# Patient Record
Sex: Male | Born: 1970 | Hispanic: Yes | Marital: Married | Smoking: Never smoker
Health system: Southern US, Community
[De-identification: ages and names within clinical notes are randomized; demographics above are authoritative.]

## PROBLEM LIST (undated history)

## (undated) DIAGNOSIS — H269 Unspecified cataract: Secondary | ICD-10-CM

## (undated) HISTORY — DX: Unspecified cataract: H26.9

## (undated) HISTORY — PX: EYE SURGERY: SHX253

## (undated) HISTORY — PX: VASECTOMY: SHX75

---

## 2013-09-27 ENCOUNTER — Ambulatory Visit (INDEPENDENT_AMBULATORY_CARE_PROVIDER_SITE_OTHER): Payer: BC Managed Care – PPO | Admitting: Emergency Medicine

## 2013-09-27 VITALS — BP 110/80 | HR 51 | Temp 98.0°F | Resp 16 | Ht 67.5 in | Wt 147.6 lb

## 2013-09-27 DIAGNOSIS — D72819 Decreased white blood cell count, unspecified: Secondary | ICD-10-CM

## 2013-09-27 DIAGNOSIS — E785 Hyperlipidemia, unspecified: Secondary | ICD-10-CM

## 2013-09-27 DIAGNOSIS — R51 Headache: Secondary | ICD-10-CM

## 2013-09-27 LAB — POCT CBC
GRANULOCYTE PERCENT: 56.7 % (ref 37–80)
HCT, POC: 42.2 % — AB (ref 43.5–53.7)
Hemoglobin: 13.5 g/dL — AB (ref 14.1–18.1)
Lymph, poc: 1.5 (ref 0.6–3.4)
MCH, POC: 27.1 pg (ref 27–31.2)
MCHC: 32 g/dL (ref 31.8–35.4)
MCV: 84.7 fL (ref 80–97)
MID (CBC): 0.2 (ref 0–0.9)
MPV: 8.2 fL (ref 0–99.8)
POC Granulocyte: 2.2 (ref 2–6.9)
POC LYMPH PERCENT: 37.4 %L (ref 10–50)
POC MID %: 5.9 %M (ref 0–12)
Platelet Count, POC: 255 10*3/uL (ref 142–424)
RBC: 4.98 M/uL (ref 4.69–6.13)
RDW, POC: 14.5 %
WBC: 3.9 10*3/uL — AB (ref 4.6–10.2)

## 2013-09-27 LAB — LIPID PANEL
Cholesterol: 174 mg/dL (ref 0–200)
HDL: 37 mg/dL — ABNORMAL LOW (ref >39)
LDL Cholesterol: 113 mg/dL — ABNORMAL HIGH (ref 0–99)
Total CHOL/HDL Ratio: 4.7 Ratio
Triglycerides: 119 mg/dL (ref <150)
VLDL: 24 mg/dL (ref 0–40)

## 2013-09-27 LAB — COMPREHENSIVE METABOLIC PANEL
ALBUMIN: 4.5 g/dL (ref 3.5–5.2)
ALK PHOS: 55 U/L (ref 39–117)
ALT: 19 U/L (ref 0–53)
AST: 19 U/L (ref 0–37)
BUN: 15 mg/dL (ref 6–23)
CO2: 28 mEq/L (ref 19–32)
Calcium: 9.6 mg/dL (ref 8.4–10.5)
Chloride: 102 mEq/L (ref 96–112)
Creat: 1.18 mg/dL (ref 0.50–1.35)
GLUCOSE: 97 mg/dL (ref 70–99)
POTASSIUM: 5 meq/L (ref 3.5–5.3)
SODIUM: 140 meq/L (ref 135–145)
TOTAL PROTEIN: 7.1 g/dL (ref 6.0–8.3)
Total Bilirubin: 0.4 mg/dL (ref 0.2–1.2)

## 2013-09-27 LAB — HIV ANTIBODY (ROUTINE TESTING W REFLEX): HIV: NONREACTIVE

## 2013-09-27 LAB — GLUCOSE, POCT (MANUAL RESULT ENTRY): POC Glucose: 89 mg/dl (ref 70–99)

## 2013-09-27 NOTE — Progress Notes (Signed)
   Subjective:    Patient ID: Jon Levy, male    DOB: 19-Apr-1971, 43 y.o.   MRN: [id number]  HPIPt here for headache and he thinks high cholesterol, he is from [place]. He has been diagnosed in his country with high cholesterol. He has had headache in the mornings. Denies blurry vision, sob, or chest pain.  Family history of diabetes and hypertension. But never taken medicine for high cholesterol. He was eating healthy and exercising.    Review of Systems     Objective:   Physical Exam H. EENT exam is normal. Disc margins are sharp. Neck supple. There is no thyromegaly. Chest clear to auscultation and percussion. Heart regular rate no murmurs rubs or gallops appreciated. Abdomen soft no tenderness or masses. Extremities  without cyanosis clubbing or edema.  Results for orders placed in visit on 09/27/13  POCT CBC      Result Value Ref Range   WBC 3.9 (*) 4.6 - 10.2 K/uL   Lymph, poc 1.5  0.6 - 3.4   POC LYMPH PERCENT 37.4  10 - 50 %L   MID (cbc) 0.2  0 - 0.9   POC MID % 5.9  0 - 12 %M   POC Granulocyte 2.2  2 - 6.9   Granulocyte percent 56.7  37 - 80 %G   RBC 4.98  4.69 - 6.13 M/uL   Hemoglobin 13.5 (*) 14.1 - 18.1 g/dL   HCT, POC 40.942.2 (*) 81.143.5 - 53.7 %   MCV 84.7  80 - 97 fL   MCH, POC 27.1  27 - 31.2 pg   MCHC 32.0  31.8 - 35.4 g/dL   RDW, POC 91.414.5     Platelet Count, POC 255  142 - 424 K/uL   MPV 8.2  0 - 99.8 fL  GLUCOSE, POCT (MANUAL RESULT ENTRY)      Result Value Ref Range   POC Glucose 89  70 - 99 mg/dl        Assessment & Plan:  His exam is essentially normal. I will call patient once again his lipids back.. I. didn't HIV test because of his slightly low hemoglobin and low white count

## 2013-12-19 ENCOUNTER — Ambulatory Visit (INDEPENDENT_AMBULATORY_CARE_PROVIDER_SITE_OTHER): Payer: BC Managed Care – PPO | Admitting: Family Medicine

## 2013-12-19 ENCOUNTER — Encounter: Payer: Self-pay | Admitting: Family Medicine

## 2013-12-19 VITALS — BP 134/77 | HR 66 | Temp 98.3°F | Resp 20 | Ht 67.0 in | Wt 150.0 lb

## 2013-12-19 DIAGNOSIS — R109 Unspecified abdominal pain: Secondary | ICD-10-CM

## 2013-12-19 DIAGNOSIS — Z8719 Personal history of other diseases of the digestive system: Secondary | ICD-10-CM

## 2013-12-19 LAB — POCT URINALYSIS DIPSTICK
Bilirubin, UA: NEGATIVE
Blood, UA: NEGATIVE
GLUCOSE UA: NEGATIVE
Ketones, UA: NEGATIVE
Leukocytes, UA: NEGATIVE
NITRITE UA: NEGATIVE
Protein, UA: NEGATIVE
Spec Grav, UA: 1.015
UROBILINOGEN UA: 0.2
pH, UA: 7

## 2013-12-19 MED ORDER — OMEPRAZOLE 20 MG PO CPDR: 20.0000 mg | DELAYED_RELEASE_CAPSULE | Freq: Every day | ORAL | Status: DC

## 2013-12-19 NOTE — Patient Instructions (Signed)
Gastritis - Adultos  °(Gastritis, Adult) ° Gastrittis es la hinchazón e irritación (inflamación) del revestimiento interno del estómago. Si no recibe tratamiento, la gastritis puede causar sangrado y llagas.(úlceras) en el estómago. °CUIDADOS EN EL HOGAR  °· Sólo tome los medicamentos según le indique el médico. °· Si le han recetado antibióticos, tómelos según las indicaciones. Termine de tomar el medicamento, aunque comience a sentirse mejor. °· Beba gran cantidad de líquido para mantener el pis (orina) de tono claro o amarillo pálido. °· Evite las comidas y bebidas que empeoran los problemas. Los alimentos que debe evitar son: °¨ Cafeína y alcohol. °¨ Chocolate. °¨ Menta. °¨ Ajo y cebolla. °¨ Comidas muy condimentadas. °¨ Cítricos como naranjas, limones o limas. °¨ Alimentos que contengan tomate, como salsas, chile y pizza. °¨ Alimentos fritos y grasos. °· Haga comidas pequeñas durante el día en lugar de 3 comidas abundantes. °SOLICITE AYUDA DE INMEDIATO SI:  °· La materia fecal (heces)es negra o de color rojo oscuro. °· Vomita sangre. Puede ser similar a la borra del café °· No puede retener los líquidos. °· El dolor en el vientre (abdominal) empeora. °· Tiene fiebre. °· No mejora luego de 1 semana. °· Tiene preguntas o preocupaciones. °ASEGÚRESE DE QUE:  °· Comprende estas instrucciones. °· Controlará su enfermedad. °· Solicitará ayuda de inmediato si no mejora o si empeora. °Document Released: 11/21/2011 °ExitCare® Patient Information ©2015 [employer]. This information is not intended to replace advice given to you by your health care provider. Make sure you discuss any questions you have with your health care provider. ° °

## 2013-12-19 NOTE — Progress Notes (Signed)
Subjective:    Patient ID: Jon Levy, male    DOB: [birth year], 43 y.o.   MRN: [id number]  HPI  This 43 y.o. [place] male is here for evaluation of epigastric discomfort, onset several weeeks ago. He has a hx of gastritis diagnosed many years ago; he does not recall taking any prescription medication for this problem. HE takes no OTC medication now when it flares up. He has some minor indigestion-like symptoms w/ certain foods.  He c/o L flank pain; lying on the left side at night provides minimal relief. He notes no change in bladder or bowel habits. He denies BRBPR or melena.  He does avoid most dairy products as these foods cause much gas and bloating.  PMHx, Surg Hx, Soc and Fam Hx reviewed.   Review of Systems  Constitutional: Negative.   HENT: Negative.   Eyes: Negative.   Respiratory: Negative.   Cardiovascular: Negative.   Gastrointestinal: Positive for abdominal pain and abdominal distention. Negative for nausea, vomiting, diarrhea, constipation, blood in stool and rectal pain.  Endocrine: Negative.   Musculoskeletal: Negative.   Skin: Negative.   Neurological: Negative.   Hematological: Negative.   Psychiatric/Behavioral: Negative.       Objective:   Physical Exam  Nursing note and vitals reviewed. Constitutional: He is oriented to person, place, and time. Vital signs are normal. He appears well-developed and well-nourished. No distress.  HENT:  Head: Normocephalic and atraumatic.  Right Ear: Hearing, tympanic membrane, external ear and ear canal normal.  Left Ear: Hearing, tympanic membrane, external ear and ear canal normal.  Nose: Nose normal. No mucosal edema, nasal deformity or septal deviation.  Mouth/Throat: Uvula is midline, oropharynx is clear and moist and mucous membranes are normal. No oral lesions. Normal dentition.  Eyes: EOM and lids are normal. Pupils are equal, round, and reactive to light. No scleral icterus.  Neck: Normal range of motion and full  passive range of motion without pain. Neck supple. No spinous process tenderness and no muscular tenderness present. No mass and no thyromegaly present.  Cardiovascular: Normal rate, regular rhythm, S1 normal, S2 normal and normal heart sounds.   No extrasystoles are present. Exam reveals no gallop and no friction rub.   No murmur heard. Pulmonary/Chest: Effort normal and breath sounds normal. No respiratory distress.  Abdominal: Soft. Normal appearance. He exhibits no distension and no mass. Bowel sounds are decreased. There is no hepatosplenomegaly or hepatomegaly. There is tenderness in the epigastric area and left upper quadrant. There is no guarding and no CVA tenderness.  Musculoskeletal: Normal range of motion. He exhibits no edema and no tenderness.  Neurological: He is alert and oriented to person, place, and time. No cranial nerve deficit. He exhibits normal muscle tone. Coordination normal.  Skin: Skin is warm and dry. No rash noted. He is not diaphoretic. No erythema.  Psychiatric: He has a normal mood and affect. His behavior is normal. Judgment and thought content normal.    Results for orders placed in visit on 12/19/13  POCT URINALYSIS DIPSTICK      Result Value Ref Range   Color, UA yellow     Clarity, UA clear     Glucose, UA neg     Bilirubin, UA neg     Ketones, UA neg     Spec Grav, UA 1.015     Blood, UA neg     pH, UA 7.0     Protein, UA neg     Urobilinogen, UA  0.2     Nitrite, UA neg     Leukocytes, UA Negative        Assessment & Plan:  Flank pain - Plan: POCT urinalysis dipstick, H. pylori antibody, IgG  Hx of gastritis - Trial of PPI and continued dietary modifications. Plan: POCT urinalysis dipstick, H. pylori antibody, IgG   Meds ordered this encounter  Medications  . omeprazole (PRILOSEC) 20 MG capsule    Sig: Take 1 capsule (20 mg total) by mouth daily.    Dispense:  30 capsule    Refill:  3

## 2013-12-21 DIAGNOSIS — Z8719 Personal history of other diseases of the digestive system: Secondary | ICD-10-CM | POA: Insufficient documentation

## 2013-12-22 LAB — H. PYLORI ANTIBODY, IGG: H Pylori IgG: >8 {ISR} — ABNORMAL HIGH

## 2013-12-23 ENCOUNTER — Other Ambulatory Visit: Payer: Self-pay | Admitting: Family Medicine

## 2013-12-23 MED ORDER — AMOXICILL-CLARITHRO-LANSOPRAZ PO MISC: Freq: Two times a day (BID) | ORAL | Status: DC

## 2013-12-29 ENCOUNTER — Ambulatory Visit (INDEPENDENT_AMBULATORY_CARE_PROVIDER_SITE_OTHER): Payer: BC Managed Care – PPO | Admitting: Emergency Medicine

## 2013-12-29 VITALS — BP 122/78 | HR 78 | Temp 98.0°F | Resp 17 | Ht 68.5 in | Wt 150.0 lb

## 2013-12-29 DIAGNOSIS — H6120 Impacted cerumen, unspecified ear: Secondary | ICD-10-CM

## 2013-12-29 DIAGNOSIS — H6123 Impacted cerumen, bilateral: Secondary | ICD-10-CM

## 2013-12-29 NOTE — Patient Instructions (Signed)
Impaccion De Cerumen °(Cerumen Impaction) °Su examen muestra que usted ha tenido una impacción de cerumen. Ésto significa que el cerumen del oído se ha compactado y ha formado un tapón. Este tapon generalmente causa reducción de la audición; pero a veces también causa dolor de oído o mareo. La extracción de la impacción de cerumen puede ser difícil y dolorosa, ya que el cerumen se adhiere al conducto auditivo, el cual es muy sensible y sangra fácilmente. Si usted trata de remover una gran acumulación de cerumen del oído, utilizando un palillo de algodón, ésto puede hacer que el cerumen se introduzca aún más hacia adentro. °La irrigación con agua, succión, y el uso de pequeñas curetas para el oído pueden asistir en la limpieza del cerumen. Si la impacción se encuentra fijada a la piel del conducto auditivo, podrá ser necesario usar gotas para el oído, por varios días, para aflojar el cerumen. Las personas que forman mucho cerumen con frecuencia, pueden usar productos a la venta en la farmacia para removerlo. °SOLICITE ATENCIÓN MÉDICA SI: °Usted desarrolla un dolor de oído, aumento pérdida de la audición, o mareo pronunciado. °Document Released: 05/22/2005 Document Revised: 08/14/2011 °ExitCare® Patient Information ©2015 [employer]. This information is not intended to replace advice given to you by your health care provider. Make sure you discuss any questions you have with your health care provider. ° °

## 2013-12-29 NOTE — Progress Notes (Signed)
[facility] [address] [place] [address] [phone]  Date:  12/29/2013   Name:  Jon Levy   DOB:  05-25-1971   MRN:  [id number]  PCP:  No PCP Per Patient    Chief Complaint: Otalgia and Jaw Pain   History of Present Illness:  BENJIMEN KELLEY is a 43 y.o. very pleasant male patient who presents with the following:  Has pain in right ear and left ear is stopped up with diminished hearing. Says had intense right ear pain last night after eating ice.  No fever or chills, cough or coryza. No improvement with over the counter medications or other home remedies. Denies other complaint or health concern today.   Patient Active Problem List   Diagnosis Date Noted  . History of gastritis 12/21/2013    No past medical history on file.  No past surgical history on file.  History  Substance Use Topics  . Smoking status: Never Smoker   . Smokeless tobacco: Not on file  . Alcohol Use: Not on file    No family history on file.  No Known Allergies  Medication list has been reviewed and updated.  Current Outpatient Prescriptions on File Prior to Visit  Medication Sig Dispense Refill  . amoxicillin-clarithromycin-lansoprazole (PREVPAC) combo pack Take by mouth 2 (two) times daily. Follow package directions.  1 kit  0  . omeprazole (PRILOSEC) 20 MG capsule Take 1 capsule (20 mg total) by mouth daily.  30 capsule  3   No current facility-administered medications on file prior to visit.    Review of Systems:  As per HPI, otherwise negative.    Physical Examination: Filed Vitals:   12/29/13 1020  BP: 122/78  Pulse: 78  Temp: 98 F (36.7 C)  Resp: 17   Filed Vitals:   12/29/13 1020  Height: 5' 8.5" (1.74 m)  Weight: 150 lb (68.04 kg)   Body mass index is 22.47 kg/(m^2). Ideal Body Weight: Weight in (lb) to have BMI = 25: 166.5   GEN: WDWN, NAD, Non-toxic, Alert & Oriented x 3 HEENT: Atraumatic, Normocephalic.  Ears and Nose: No external  deformity. Bilateral cerumen impaction EXTR: No clubbing/cyanosis/edema NEURO: Normal gait.  PSYCH: Normally interactive. Conversant. Not depressed or anxious appearing.  Calm demeanor.    Assessment and Plan: Cerumen impaction Lavage  Signed,  Ellison Carwin, MD

## 2015-02-23 ENCOUNTER — Ambulatory Visit (INDEPENDENT_AMBULATORY_CARE_PROVIDER_SITE_OTHER): Payer: BLUE CROSS/BLUE SHIELD | Admitting: Family Medicine

## 2015-02-23 VITALS — BP 120/80 | HR 58 | Temp 97.4°F | Resp 16 | Ht 69.0 in | Wt 158.0 lb

## 2015-02-23 DIAGNOSIS — Z1389 Encounter for screening for other disorder: Secondary | ICD-10-CM

## 2015-02-23 DIAGNOSIS — Z1329 Encounter for screening for other suspected endocrine disorder: Secondary | ICD-10-CM

## 2015-02-23 DIAGNOSIS — Z0000 Encounter for general adult medical examination without abnormal findings: Secondary | ICD-10-CM | POA: Diagnosis not present

## 2015-02-23 DIAGNOSIS — R1013 Epigastric pain: Secondary | ICD-10-CM | POA: Diagnosis not present

## 2015-02-23 DIAGNOSIS — Z1383 Encounter for screening for respiratory disorder NEC: Secondary | ICD-10-CM

## 2015-02-23 DIAGNOSIS — Z136 Encounter for screening for cardiovascular disorders: Secondary | ICD-10-CM | POA: Diagnosis not present

## 2015-02-23 DIAGNOSIS — Z23 Encounter for immunization: Secondary | ICD-10-CM

## 2015-02-23 LAB — CBC WITH DIFFERENTIAL/PLATELET
BASOS ABS: 0 10*3/uL (ref 0.0–0.1)
Basophils Relative: 0 % (ref 0–1)
EOS ABS: 0 10*3/uL (ref 0.0–0.7)
Eosinophils Relative: 1 % (ref 0–5)
HEMATOCRIT: 44.8 % (ref 39.0–52.0)
HEMOGLOBIN: 15.3 g/dL (ref 13.0–17.0)
LYMPHS ABS: 1.8 10*3/uL (ref 0.7–4.0)
Lymphocytes Relative: 42 % (ref 12–46)
MCH: 27.2 pg (ref 26.0–34.0)
MCHC: 34.2 g/dL (ref 30.0–36.0)
MCV: 79.6 fL (ref 78.0–100.0)
MPV: 9.1 fL (ref 8.6–12.4)
Monocytes Absolute: 0.4 10*3/uL (ref 0.1–1.0)
Monocytes Relative: 10 % (ref 3–12)
NEUTROS ABS: 2 10*3/uL (ref 1.7–7.7)
Neutrophils Relative %: 47 % (ref 43–77)
PLATELETS: 211 10*3/uL (ref 150–400)
RBC: 5.63 MIL/uL (ref 4.22–5.81)
RDW: 14.2 % (ref 11.5–15.5)
WBC: 4.3 10*3/uL (ref 4.0–10.5)

## 2015-02-23 LAB — LIPID PANEL
CHOLESTEROL: 193 mg/dL (ref 125–200)
HDL: 36 mg/dL — ABNORMAL LOW (ref >=40)
LDL Cholesterol: 108 mg/dL (ref <130)
Total CHOL/HDL Ratio: 5.4 Ratio — ABNORMAL HIGH (ref <=5.0)
Triglycerides: 245 mg/dL — ABNORMAL HIGH (ref <150)
VLDL: 49 mg/dL — AB (ref <30)

## 2015-02-23 LAB — COMPREHENSIVE METABOLIC PANEL
ALK PHOS: 48 U/L (ref 40–115)
ALT: 24 U/L (ref 9–46)
AST: 19 U/L (ref 10–40)
Albumin: 4.6 g/dL (ref 3.6–5.1)
BUN: 13 mg/dL (ref 7–25)
CALCIUM: 9.3 mg/dL (ref 8.6–10.3)
CO2: 28 mmol/L (ref 20–31)
Chloride: 104 mmol/L (ref 98–110)
Creat: 0.96 mg/dL (ref 0.60–1.35)
Glucose, Bld: 88 mg/dL (ref 65–99)
Potassium: 4.4 mmol/L (ref 3.5–5.3)
Sodium: 140 mmol/L (ref 135–146)
TOTAL PROTEIN: 7.1 g/dL (ref 6.1–8.1)
Total Bilirubin: 0.5 mg/dL (ref 0.2–1.2)

## 2015-02-23 LAB — TSH: TSH: 1.479 u[IU]/mL (ref 0.350–4.500)

## 2015-02-23 MED ORDER — OMEPRAZOLE 40 MG PO CPDR: 40.0000 mg | DELAYED_RELEASE_CAPSULE | Freq: Every day | ORAL | Status: DC

## 2015-02-23 NOTE — Progress Notes (Signed)
Patient ID: Jon Levy, male   DOB: 03-18-1971, 44 y.o.   MRN: [id number]  Case discussed with Dr. Noreene Filbert when patient is in the office. EKG: NSR, no ischemic changes. Pt with continuing gastritis sxs with + h. Pylori ab last yr treated with prev pac so repeat breath test today to ensure eradication and restart ppi. Reviewed documentation and EKG and agree w/ assessment and plan.  Jon Sorenson, MD MPH

## 2015-02-23 NOTE — Progress Notes (Signed)
   Subjective:    Jon Levy - 44 y.o. male MRN [id number]  Date of birth: Mar 19, 1971  HPI  Jon Levy is here for here for his physical and centralized abdominal pain.   He is originally from [place].  He moved to the [place] and [place] about 3 year ago.  He has two children.  His wife teaches Spanish.  He denies any PMH.  Never tobacco smoker and no EtOH or illicit drug use.  Does not perform regular exercise  Fhx for mother with diabetes and passed away from brain tumor and father with HTN and Dm2 and sclerosis.    ABDOMINAL PAIN  Pain is centralized and radiates proximally.  It is worse in the am.  Improves after he eats.  Pain is sharp and intermittent in nature.  Hx of H. Pylori and gastritis upon chart review.  Pain began several month ago.  Medications tried: none currently but has taken omeprazole which improved his symptoms  Similar pain before:yes  Prior abdominal surgeries: no  Symptoms Nausea/vomiting: no Diarrhea: no Constipation: no Blood in stool: no Blood in vomit: no Fever: no Dysuria: no Loss of appetite: no Weight loss: no  Health Maintenance:  Health Maintenance Due  Topic Date Due  . TETANUS/TDAP  07/18/1987  . INFLUENZA VACCINE  01/04/2015    -  reports that he has never smoked. He does not have any smokeless tobacco history on file. - Review of Systems: Per HPI. - Past Medical History: Patient Active Problem List   Diagnosis Date Noted  . History of gastritis 12/21/2013   - Medications: reviewed and updated Current Outpatient Prescriptions  Medication Sig Dispense Refill  . omeprazole (PRILOSEC) 20 MG capsule Take 1 capsule (20 mg total) by mouth daily. (Patient not taking: Reported on 02/23/2015) 30 capsule 3   No current facility-administered medications for this visit.     Review of Systems See HPI     Objective:   Physical Exam BP 120/80 mmHg  Pulse 58  Temp(Src) 97.4 F (36.3 C) (Oral)  Resp 16  Ht  (1.753  m)  Wt 158 lb (71.668 kg)  BMI 23.32 kg/m2  SpO2 98% Gen: NAD, alert, cooperative with exam, well-appearing HEENT: NCAT, clear conjunctiva,  supple neck CV: RRR, good S1/S2, no murmur, no edema, capillary refill brisk  Resp: CTABL, no wheezes, non-labored Abd: SNTND, BS present, no guarding or organomegaly Skin: no rashes, normal turgor  Neuro: no gross deficits.  Psych: good insight, alert and oriented     Assessment & Plan:   Annual exam  Doing well  TSH  EKG  F/u in 1 year   Abdominal pain: most likely gastritis.  Possible for h. Pylori as patient doesn't seem to report taking medication.  No weight loss, melena or hematemesis.  - refill ompreprazole  - breath test today  - CMP

## 2015-02-23 NOTE — Patient Instructions (Signed)
Dolor abdominal °(Abdominal Pain) °El dolor puede tener muchas causas. Normalmente la causa del dolor abdominal no es una enfermedad y mejorará sin tratamiento. Frecuentemente puede controlarse y tratarse en casa. Su médico le realizará un examen físico y posiblemente solicite análisis de sangre y radiografías para ayudar a determinar la gravedad de su dolor. Sin embargo, en muchos casos, debe transcurrir más tiempo antes de que se pueda encontrar una causa evidente del dolor. Antes de llegar a ese punto, es posible que su médico no sepa si necesita más pruebas o un tratamiento más profundo. °INSTRUCCIONES PARA EL CUIDADO EN EL HOGAR  °Esté atento al dolor para ver si hay cambios. Las siguientes indicaciones ayudarán a aliviar cualquier molestia que pueda sentir: °· Tome solo medicamentos de venta libre o recetados, según las indicaciones del médico. °· No tome laxantes a menos que se lo haya indicado su médico. °· Pruebe con una dieta líquida absoluta (caldo, té o agua) según se lo indique su médico. Introduzca gradualmente una dieta normal, según su tolerancia. °SOLICITE ATENCIÓN MÉDICA SI: °· Tiene dolor abdominal sin explicación. °· Tiene dolor abdominal relacionado con náuseas o diarrea. °· Tiene dolor cuando orina o defeca. °· Experimenta dolor abdominal que lo despierta de noche. °· Tiene dolor abdominal que empeora o mejora cuando come alimentos. °· Tiene dolor abdominal que empeora cuando come alimentos grasosos. °· Tiene fiebre. °SOLICITE ATENCIÓN MÉDICA DE INMEDIATO SI:  °· El dolor no desaparece en un plazo máximo de 2 horas. °· No deja de (vomitar). °· El dolor se siente solo en partes del abdomen, como el lado derecho o la parte inferior izquierda del abdomen. °· Evacúa materia fecal sanguinolenta o negra, de aspecto alquitranado. °ASEGÚRESE DE QUE: °· Comprende estas instrucciones. °· Controlará su afección. °· Recibirá ayuda de inmediato si no mejora o si empeora. °Document Released: 05/22/2005  Document Revised: 05/27/2013 °ExitCare® Patient Information ©2015 [employer]. This information is not intended to replace advice given to you by your health care provider. Make sure you discuss any questions you have with your health care provider. ° °

## 2015-02-24 LAB — H. PYLORI BREATH TEST: H. PYLORI BREATH TEST: NOT DETECTED

## 2015-04-05 ENCOUNTER — Encounter (HOSPITAL_COMMUNITY)
Admission: RE | Disposition: A | Discharge status: Home / Self Care | Payer: Self-pay | Source: Ambulatory Visit | Attending: Ophthalmology

## 2015-04-05 ENCOUNTER — Ambulatory Visit (HOSPITAL_COMMUNITY)
Admission: RE | Admit: 2015-04-05 | Discharge: 2015-04-05 | Disposition: A | Discharge status: Home / Self Care | Payer: BLUE CROSS/BLUE SHIELD | Source: Ambulatory Visit | Attending: Ophthalmology | Admitting: Ophthalmology

## 2015-04-05 ENCOUNTER — Encounter (HOSPITAL_COMMUNITY): Payer: Self-pay | Admitting: *Deleted

## 2015-04-05 DIAGNOSIS — H5201 Hypermetropia, right eye: Secondary | ICD-10-CM | POA: Diagnosis not present

## 2015-04-05 DIAGNOSIS — H26492 Other secondary cataract, left eye: Secondary | ICD-10-CM | POA: Insufficient documentation

## 2015-04-05 DIAGNOSIS — H524 Presbyopia: Secondary | ICD-10-CM | POA: Insufficient documentation

## 2015-04-05 DIAGNOSIS — H52223 Regular astigmatism, bilateral: Secondary | ICD-10-CM | POA: Insufficient documentation

## 2015-04-05 DIAGNOSIS — Z961 Presence of intraocular lens: Secondary | ICD-10-CM | POA: Diagnosis not present

## 2015-04-05 HISTORY — PX: YAG LASER APPLICATION: SHX6189

## 2015-04-05 SURGERY — TREATMENT, USING YAG LASER
Anesthesia: LOCAL | Laterality: Left

## 2015-04-05 MED ORDER — TETRACAINE HCL 0.5 % OP SOLN
OPHTHALMIC | Status: AC
  Filled 2015-04-05: qty 2

## 2015-04-05 MED ORDER — TROPICAMIDE 1 % OP SOLN
OPHTHALMIC | Status: AC
  Filled 2015-04-05: qty 3

## 2015-04-05 MED ORDER — TETRACAINE HCL 0.5 % OP SOLN
1.0000 [drp] | Freq: Once | OPHTHALMIC | Status: AC
  Administered 2015-04-05: 1 [drp] via OPHTHALMIC

## 2015-04-05 MED ORDER — TROPICAMIDE 1 % OP SOLN
1.0000 [drp] | OPHTHALMIC | Status: AC
  Administered 2015-04-05 (×3): 1 [drp] via OPHTHALMIC

## 2015-04-05 NOTE — H&P (Signed)
I have reviewed the pre printed H&P, the patient was re-examined, and I have identified no significant interval changes in the patient's medical condition.  There is no change in the plan of care since the history and physical of record. 

## 2015-04-05 NOTE — Brief Op Note (Signed)
Jon Levy 04/05/2015  [name] [name], MD  Pre-op Diagnosis:  secondary cataract left eye  Post-op Diagnosis: same  Yag laser self-test completed: Yes.    Indications:  See scanned office H&P for indications  Procedure:  YAG posterior capsulotomy OS  Eye protection worn by staff:  Yes.   Laser In Use sign on door:  Yes.    Laser:  {LUMENIS SELECTA DUET YAG/SLT LASER  Power Setting:  1.7 mJ/burst Anatomical site treated:  Posterior capsule OS Number of applications:  35 Total energy delivered: 57.00  mJ Results:  Open visual axis OS  Patient was instructed to go to the office, as previously scheduled, for intraocular pressure:  No.  Patient verbalizes understanding of discharge instructions:  Yes.    Notes:   Patient tolerated procedure well.  There were no complications.

## 2015-04-05 NOTE — Discharge Instructions (Signed)
Riki Sheerdgar R Milling  04/05/2015     Instructions    Activity: No Restrictions.   Diet: Resume Diet you were on at home.   Pain Medication: Tylenol if Needed.   CONTACT YOUR DOCTOR IF YOU HAVE PAIN, REDNESS IN YOUR EYE, OR DECREASED VISION.   Follow-up: 04/27/2015 at 11:15 with [name] F Haines, MD.   Dr. Lita MainsHaines: [phone]        If you find that you cannot contact your physician, but feel that your signs and   Symptoms warrant a physician's attention, call the Emergency Room at   [phone] ext.[phone].

## 2016-03-15 NOTE — Progress Notes (Signed)
Subjective:    Patient ID: Jon Levy, male    DOB: February 11, 1971, 45 y.o.   MRN: [id number] Chief Complaint  Patient presents with  . Immunizations    flu vaccine and tdap  . Flank Pain    LEFT side on/off with bloating in the morning    HPI  Jon Levy is a delightful 45 yo male last seen 1 yr prior for his CPE here today to update his immunizations.  He nhad morning HA x 2-3 yrs and went away when he stopped eating chicken in the morning Uese ppi occastionally - after doctor tells him.  He has 15 yrs of pain and  10 years ago he saw  - in [place]. -= 2 years  No past medical history on file. Past Surgical History:  Procedure Laterality Date  . VASECTOMY    . YAG LASER APPLICATION Left 04/05/2015   Procedure: YAG LASER APPLICATION;  Surgeon: Susa Simmonds, MD;  Location: [facility];  Service: Ophthalmology;  Laterality: Left;   No current outpatient prescriptions on file prior to visit.   No current facility-administered medications on file prior to visit.    Not on File Family History  Problem Relation Age of Onset  . Diabetes Mother   . Hypertension Mother   . High Cholesterol Mother   . Diabetes Father   . High Cholesterol Father    Social History   Social History  . Marital status: Married    Spouse name: N/A  . Number of children: N/A  . Years of education: N/A   Social History Main Topics  . Smoking status: Never Smoker  . Smokeless tobacco: None  . Alcohol use None  . Drug use: Unknown  . Sexual activity: Not Asked   Other Topics Concern  . None   Social History Narrative  . None    Review of Systems See hpi    Objective:   Physical Exam  Constitutional: He appears well-developed and well-nourished. No distress.  HENT:  Head: Normocephalic and atraumatic.  Neck: Normal range of motion. Neck supple. No thyromegaly present.  Cardiovascular: Normal rate, regular rhythm and normal heart sounds.   Pulmonary/Chest: Effort normal and breath  sounds normal.  Abdominal: Soft. Normal appearance and bowel sounds are normal. He exhibits no distension and no mass. There is no tenderness. There is no rebound, no guarding and no CVA tenderness. No hernia.  Genitourinary: Rectum normal and prostate normal. Rectal exam shows no tenderness, anal tone normal and guaiac negative stool.  Lymphadenopathy:    He has no cervical adenopathy.  Skin: He is not diaphoretic.    BP 110/88 (BP Location: Left Arm, Patient Position: Sitting, Cuff Size: Normal)   Pulse 73   Temp 98.4 F (36.9 C) (Oral)   Resp 16   Ht 5\' 8"  (1.727 m)   Wt 161 lb 9.6 oz (73.3 kg)   SpO2 98%   BMI 24.57 kg/m    Results for orders placed or performed in visit on 03/16/16  POCT urinalysis dipstick  Result Value Ref Range   Color, UA yellow yellow   Clarity, UA clear clear   Glucose, UA negative negative   Bilirubin, UA negative negative   Ketones, POC UA negative negative   Spec Grav, UA 1.010    Blood, UA trace-intact (A) negative   pH, UA 7.0    Protein Ur, POC negative negative   Urobilinogen, UA 0.2    Nitrite, UA Negative Negative  Leukocytes, UA Negative Negative  POCT CBC  Result Value Ref Range   WBC 5.6 4.6 - 10.2 K/uL   Lymph, poc 1.9 0.6 - 3.4   POC LYMPH PERCENT 33.7 10 - 50 %L   MID (cbc) 0.2 0 - 0.9   POC MID % 3.0 0 - 12 %M   POC Granulocyte 3.5 2 - 6.9   Granulocyte percent 63.3 37 - 80 %G   RBC 5.39 4.69 - 6.13 M/uL   Hemoglobin 14.8 14.1 - 18.1 g/dL   HCT, POC 16.142.1 (A) 09.643.5 - 53.7 %   MCV 78.2 (A) 80 - 97 fL   MCH, POC 27.4 27 - 31.2 pg   MCHC 35.1 31.8 - 35.4 g/dL   RDW, POC 04.513.8 %   Platelet Count, POC 233 142 - 424 K/uL   MPV 6.8 0 - 99.8 fL       Assessment & Plan:  If he does not get improvement from the GI cocktail, would recommend abd xray 1. Need for prophylactic vaccination and inoculation against influenza   2. Flank pain, acute   3. Abdominal pain, left upper quadrant     Orders Placed This Encounter    Procedures  . US Abdomen Complete    Wt-161/not diab/no needs Ins-[facility],pt Epic order    Standing Status:   Future    Number of Occurrences:   1    Standing Expiration Date:   05/16/2017    Order Specific Question:   Reason for Exam (SYMPTOM  OR DIAGNOSIS REQUIRED)    Answer:   recurrent left upper quandrant abd pain    Order Specific Question:   Preferred imaging location?    Answer:   [facility]  . Flu Vaccine QUAD 36+ mos IM  . Urine Microscopic  . Comprehensive metabolic panel  . Lipase  . H. pylori breath test  . POCT urinalysis dipstick  . POCT CBC    Meds ordered this encounter  Medications  . gi cocktail (Maalox,Lidocaine,Donnatal)  . lansoprazole (PREVACID) 30 MG capsule    Sig: Take 1 capsule (30 mg total) by mouth daily at 12 noon. Daily for 2 months, then as needed    Dispense:  30 capsule    Refill:  3     Norberto SorensonEva Tremane Spurgeon, M.D.  [facility]  [place] [address] [place], [place] [address] [phone] phone [phone] fax  04/07/16 8:18 AM

## 2016-03-16 ENCOUNTER — Encounter: Payer: Self-pay | Admitting: Family Medicine

## 2016-03-16 ENCOUNTER — Ambulatory Visit (INDEPENDENT_AMBULATORY_CARE_PROVIDER_SITE_OTHER): Payer: BLUE CROSS/BLUE SHIELD | Admitting: Family Medicine

## 2016-03-16 VITALS — BP 110/88 | HR 73 | Temp 98.4°F | Resp 16 | Ht 68.0 in | Wt 161.6 lb

## 2016-03-16 DIAGNOSIS — R109 Unspecified abdominal pain: Secondary | ICD-10-CM | POA: Diagnosis not present

## 2016-03-16 DIAGNOSIS — R1012 Left upper quadrant pain: Secondary | ICD-10-CM | POA: Diagnosis not present

## 2016-03-16 DIAGNOSIS — Z23 Encounter for immunization: Secondary | ICD-10-CM | POA: Diagnosis not present

## 2016-03-16 LAB — COMPREHENSIVE METABOLIC PANEL
ALBUMIN: 4.5 g/dL (ref 3.6–5.1)
ALK PHOS: 62 U/L (ref 40–115)
ALT: 22 U/L (ref 9–46)
AST: 19 U/L (ref 10–40)
BILIRUBIN TOTAL: 0.5 mg/dL (ref 0.2–1.2)
BUN: 12 mg/dL (ref 7–25)
CALCIUM: 9.2 mg/dL (ref 8.6–10.3)
CO2: 27 mmol/L (ref 20–31)
CREATININE: 0.92 mg/dL (ref 0.60–1.35)
Chloride: 102 mmol/L (ref 98–110)
Glucose, Bld: 91 mg/dL (ref 65–99)
Potassium: 4.1 mmol/L (ref 3.5–5.3)
SODIUM: 139 mmol/L (ref 135–146)
TOTAL PROTEIN: 7 g/dL (ref 6.1–8.1)

## 2016-03-16 LAB — POCT CBC
Granulocyte percent: 63.3 % (ref 37–80)
HCT, POC: 42.1 % — AB (ref 43.5–53.7)
Hemoglobin: 14.8 g/dL (ref 14.1–18.1)
Lymph, poc: 1.9 (ref 0.6–3.4)
MCH, POC: 27.4 pg (ref 27–31.2)
MCHC: 35.1 g/dL (ref 31.8–35.4)
MCV: 78.2 fL — AB (ref 80–97)
MID (cbc): 0.2 (ref 0–0.9)
MPV: 6.8 fL (ref 0–99.8)
POC Granulocyte: 3.5 (ref 2–6.9)
POC LYMPH PERCENT: 33.7 % (ref 10–50)
POC MID %: 3 % (ref 0–12)
Platelet Count, POC: 233 K/uL (ref 142–424)
RBC: 5.39 M/uL (ref 4.69–6.13)
RDW, POC: 13.8 %
WBC: 5.6 K/uL (ref 4.6–10.2)

## 2016-03-16 LAB — POCT URINALYSIS DIP (MANUAL ENTRY)
Bilirubin, UA: NEGATIVE
Glucose, UA: NEGATIVE
Ketones, POC UA: NEGATIVE
Leukocytes, UA: NEGATIVE
Nitrite, UA: NEGATIVE
Protein Ur, POC: NEGATIVE
Spec Grav, UA: 1.01
Urobilinogen, UA: 0.2
pH, UA: 7

## 2016-03-16 LAB — LIPASE: LIPASE: 15 U/L (ref 7–60)

## 2016-03-16 MED ORDER — LANSOPRAZOLE 30 MG PO CPDR: 30.0000 mg | DELAYED_RELEASE_CAPSULE | Freq: Every day | ORAL | 3 refills | Status: DC

## 2016-03-16 MED ORDER — GI COCKTAIL ~~LOC~~: 30.0000 mL | Freq: Once | ORAL | Status: AC

## 2016-03-16 NOTE — Patient Instructions (Addendum)
Your urine and blood tests are normal.  I would recommend coming to the [facility] walk-in clinic sometime when you have the abdominal pain (call ahead will make it quicker for you).  Then we could have you drink the numbing medicine to see if this is coming from your stomach and if you still have the pain, then get an xray to see if it is being caused by gas or constipation.  Alternatively, if you continue to have the plan, we could get an ultrasound to look at your abdominal organ and make sure they are normal and then refer you to a GI (gastroenterology/stomach) doctor.  Do try taking the prevacid every day for 2 months and then if the pain continues, please call so I can send you to the GI specialist.    IF you received an x-ray today, you will receive an invoice from [facility]. Please contact [facility] at [phone] with questions or concerns regarding your invoice.   IF you received labwork today, you will receive an invoice from [facility]. Please contact [facility] at [phone] with questions or concerns regarding your invoice.   Our billing staff will not be able to assist you with questions regarding bills from these companies.  You will be contacted with the lab results as soon as they are available. The fastest way to get your results is to activate your My Chart account. Instructions are located on the last page of this paperwork. If you have not heard from us regarding the results in 2 weeks, please contact this office.     Gastritis - Adultos (Gastritis, Adult)  La gastrittis es la irritacin (inflamacin) de la membrana interna del estmago. Puede ser Neomia Dearuna enfermedad de inicio sbito (aguda) o de largo plazo (crnica). Si la gastritis no se trata, puede causar sangrado y lceras. CAUSAS  La gastritis se produce cuando la membrana que tapiza interiormente al estmago se debilita o se daa. Los jugos digestivos del estmago inflaman el  revestimiento del estmago debilitado. El revestimiento del estmago puede debilitarse o daarse por una infeccin viral o bacteriana. La infeccin bacteriana ms comn es la infeccin por Helicobacter pylori. Tambin puede ser el resultado del consumo excesivo de alcohol, por el uso de ciertos medicamentos o porque hay demasiado cido en el estmago.  SNTOMAS  En algunos casos no hay sntomas. Si se presentan sntomas, stos pueden ser:   Dolor o sensacin de ardor en la parte superior del abdomen.  Nuseas.  Vmitos.  Sensacin molesta de distensin despus de comer. DIAGNSTICO  El mdico puede diagnosticar gastritis segn los sntomas y el examen fsico. Para determinar la causa de la gastritis, el mdico podr:   Pedir anlisis de sangre o de materia fecal para diagnosticar la presencia de la bacteria H pylori.  Gastroscopa. Un tubo delgado y flexible (endoscopio) se pasa por Theatre stage managerel esfago hasta llegar al Teachers Insurance and Annuity Associationestmago. El endoscopio tiene [place]a luz y una cmara en el extremo. El mdico utilizar el endoscopio para observar el interior del Darwinestmago.  Tomar una muestra de tejido (biopsia) del estmago para examinarlo en el microscopio. TRATAMIENTO  Segn la causa de la gastritis podrn recetarle: Antibiticos, si la causa es una infeccin bacteriana, como una infeccin por H. pylori. Anticidos o bloqueadores H2, si hay demasiado cido en el estmago. El Office Depotmdico le aconsejar que deje de tomar aspirina, ibuprofeno u otros antiinflamatorios no esteroides (AINE).  INSTRUCCIONES PARA EL CUIDADO EN EL HOGAR   Tome slo medicamentos de venta libre  o recetados, segn las indicaciones del mdico.  Si le han recetado antibiticos, tmelos segn las indicaciones. Tmelos todos, aunque se sienta mejor.  Debe ingerir gran cantidad de lquido para mantener la orina de tono claro o color amarillo plido.  Evite las comidas y bebidas que [address] [place], [place]:  [place] con cafena o  alcohlicas.  Chocolate.  Sabores a Advertising account planner.  Ajo y cebolla.  Comidas muy condimentadas.  Ctricos como naranjas, limones o limas.  Alimentos que contengan tomate, como salsas, [place] y pizza.  Alimentos fritos y Lexicographer.  Haga comidas pequeas durante Glass blower/designer de 3 comidas abundantes. SOLICITE ATENCIN MDICA DE INMEDIATO SI:   La materia fecal es negra o de color rojo oscuro.  Vomita sangre de color rojo brillante o material similar a granos de caf.  No puede retener los lquidos.  El dolor abdominal empeora.  Tiene fiebre.  No mejora luego de 1 semana.  Tiene preguntas o preocupaciones. ASEGRESE DE QUE:   Comprende estas instrucciones.  Controlar su enfermedad.  Solicitar ayuda de inmediato si no mejora o si empeora.   Esta informacin no tiene Theme park manager el consejo del mdico. Asegrese de hacerle al mdico cualquier pregunta que tenga.   Document Released: 03/01/2005 Document Revised: 02/10/2015 Elsevier Interactive Patient Education [employer]

## 2016-03-17 LAB — URINALYSIS, MICROSCOPIC ONLY
BACTERIA UA: NONE SEEN [HPF]
CASTS: NONE SEEN [LPF]
CRYSTALS: NONE SEEN [HPF]
RBC / HPF: NONE SEEN RBC/HPF (ref <=2)
Squamous Epithelial / LPF: NONE SEEN [HPF] (ref <=5)
WBC, UA: NONE SEEN WBC/HPF (ref <=5)
YEAST: NONE SEEN [HPF]

## 2016-03-17 LAB — H. PYLORI BREATH TEST: H. PYLORI BREATH TEST: NOT DETECTED

## 2016-03-29 ENCOUNTER — Ambulatory Visit
Admission: RE | Admit: 2016-03-29 | Discharge: 2016-03-29 | Disposition: A | Discharge status: Home / Self Care | Payer: BLUE CROSS/BLUE SHIELD | Source: Ambulatory Visit | Attending: Family Medicine | Admitting: Family Medicine

## 2016-03-29 DIAGNOSIS — R1012 Left upper quadrant pain: Secondary | ICD-10-CM

## 2016-04-19 ENCOUNTER — Ambulatory Visit (INDEPENDENT_AMBULATORY_CARE_PROVIDER_SITE_OTHER): Payer: BLUE CROSS/BLUE SHIELD | Admitting: Physician Assistant

## 2016-04-19 VITALS — BP 122/72 | HR 78 | Temp 97.9°F | Resp 17 | Ht 68.0 in | Wt 163.0 lb

## 2016-04-19 DIAGNOSIS — K219 Gastro-esophageal reflux disease without esophagitis: Secondary | ICD-10-CM | POA: Diagnosis not present

## 2016-04-19 DIAGNOSIS — R1013 Epigastric pain: Secondary | ICD-10-CM

## 2016-04-19 MED ORDER — OMEPRAZOLE 20 MG PO CPDR: 20.0000 mg | DELAYED_RELEASE_CAPSULE | Freq: Every day | ORAL | 3 refills | Status: DC

## 2016-04-19 NOTE — Progress Notes (Signed)
Jon Levy  MRN: [id number] DOB: Mar 13, 1971  PCP: No PCP Per Patient  Subjective:  Pt is a very pleasant 45 year old male, history of gastritis, who presents to clinic for chronic stomach pain x five months.  His pain is located LUQ, notes occasional burning over his sternum and bad taste in his mouth. Worse when he lays on his left side, not on his back.  Bowel movements have not changed. Describes them as well formed, no difficulty with Bm.   Morning meal - fruit, coffee about 5 cups/day, bread Lunch - rice, beans, vegetables, meat Dinner - Meat, rice. He states he has a headache in the morning if he eats chicken at night, so he does not eat chicken at night any more. He does not like tomatoes, does not eat tomato-based foods.   Was here one month ago for flank pain. US abdomen complete performed  - was negative. Negative H. Pylori test.  He was prescribed a GI cocktail and prevacid. Admits he took three days of Prevacid and discontinued his treatment. He does not like taking medications, however his pain is currently bad enough that he would like to start treatment.   Tested positive for H. Pylori three years ago and received treatment. He has been tested a few times since that time, all tests were negative. He states this abdominal pain is not nearly as bad as his pain was three years ago.   Review of Systems  Constitutional: Negative for appetite change and unexpected weight change.  HENT: Negative for sore throat and trouble swallowing.   Respiratory: Negative for cough, chest tightness, shortness of breath and wheezing.   Cardiovascular: Negative for chest pain, palpitations and leg swelling.  Gastrointestinal: Positive for abdominal distention and abdominal pain. Negative for blood in stool, constipation, diarrhea, nausea and vomiting.  Psychiatric/Behavioral: Negative for sleep disturbance. The patient is not nervous/anxious.     Patient Active Problem List   Diagnosis Date  Noted  . History of gastritis 12/21/2013    Current Outpatient Prescriptions on File Prior to Visit  Medication Sig Dispense Refill  . lansoprazole (PREVACID) 30 MG capsule Take 1 capsule (30 mg total) by mouth daily at 12 noon. Daily for 2 months, then as needed 30 capsule 3   Current Facility-Administered Medications on File Prior to Visit  Medication Dose Route Frequency Provider Last Rate Last Dose  . gi cocktail (Maalox,Lidocaine,Donnatal)  30 mL Oral Once Sherren MochaEva N Shaw, MD        Not on File   Objective:  BP 122/72 (BP Location: Right Arm, Patient Position: Sitting, Cuff Size: Normal)   Pulse 78   Temp 97.9 F (36.6 C) (Oral)   Resp 17   Ht 5\' 8"  (1.727 m)   Wt 163 lb (73.9 kg)   SpO2 97%   BMI 24.78 kg/m   Physical Exam  Constitutional: He is oriented to person, place, and time and well-developed, well-nourished, and in no distress. No distress.  Cardiovascular: Normal rate, regular rhythm and normal heart sounds.   Abdominal: Soft. Bowel sounds are normal. There is no tenderness.  Neurological: He is alert and oriented to person, place, and time. GCS score is 15.  Skin: Skin is warm and dry.  Psychiatric: Mood, memory, affect and judgment normal.  Vitals reviewed.   Assessment and Plan :  1. Gastroesophageal reflux disease, esophagitis presence not specified 2. Abdominal pain, epigastric - omeprazole (PRILOSEC) 20 MG capsule; Take 1 capsule (20 mg total)  by mouth daily.  Dispense: 30 capsule; Refill: 3 - Discussed lifestyle changes with patient. Advised eight week course of Omeprazole. He is at risk for stopping treatment early, as he only took three days of medication last time he was here.  RTC if no improvement after treatment. Patient understands and agrees.    Marco CollieWhitney Doryce Mcgregory, PA-C  [facility] 04/19/2016 9:35 AM

## 2016-04-19 NOTE — Patient Instructions (Addendum)
Cut down on your coffee to 1-2 cups/day. Completely eliminating it from your diet is best to relieve your pain. Try to start drinking tea instead.   You will take Protonix 20mg  - take 30-60 minutes before the first meal of the day for 8 weeks. If you are not better after this course of treatment, please return and we will change therapy.   GERD (REFLUX) can be treated with medication, but also with lifestyle changes including elevation of the head of your bed (ideally with 6 inch  bed blocks),  Smoking cessation, avoidance of late meals, excessive alcohol, and avoid fatty foods, chocolate, peppermint, colas, red wine, and acidic juices such as orange juice.  NO MINT OR MENTHOL PRODUCTS SO NO COUGH DROPS   USE SUGARLESS CANDY INSTEAD (Jolley ranchers or Stover's or Life Savers) or even ice chips will also do - the key is to swallow to prevent all throat clearing. NO OIL BASED VITAMINS - use powdered substitutes.  Opciones de alimentos para pacientes con reflujo gastroesofgico - Adultos (Food Choices for Gastroesophageal Reflux Disease, Adult) Cuando se tiene reflujo gastroesofgico (ERGE), los alimentos que se ingieren y los hbitos de alimentacin son muy importantes. Elegir los alimentos adecuados puede ayudar a Paramedicaliviar las molestias ocasionadas por el TutuillaERGE. QU PAUTAS GENERALES DEBO SEGUIR?  Elija las frutas, los vegetales, los cereales integrales, los productos lcteos, la carne de [place], de pescado y de ave con bajo contenido de grasas.  Limite las grasas, [address]como los Springdaleaceites, los aderezos para Bradgateensalada, la Cumminsvillemanteca, los frutos secos y Programme researcher, broadcasting/film/videoel aguacate.  Lleve un registro de las comidas para identificar los alimentos que ocasionan sntomas.  Evite los alimentos que le ocasionen reflujo. Pueden ser distintos para cada persona.  Haga comidas pequeas con frecuencia en lugar de tres comidas OfficeMax Incorporatedabundantes todos los das.  Coma lentamente, en un clima distendido.  Limite el consumo de alimentos  fritos.  Cocine los alimentos utilizando mtodos que no sean la fritura.  Evite el consumo alcohol.  Evite beber grandes cantidades de lquidos con las comidas.  Evite agacharse o recostarse hasta despus de 2 o 3horas de haber comido. QU ALIMENTOS NO SE RECOMIENDAN? Los siguientes son algunos alimentos y bebidas que pueden empeorar los sntomas: Garment/textile technologistVegetales  Tomates. Jugo de tomate. Salsa de tomate y espagueti. Ajes. Cebolla y [place]ajo. Rbano picante. Frutas  Naranjas, pomelos y limn (fruta y Sloveniajugo). Carnes  Carnes de Laker[place]vaca, de pescado y de ave con gran contenido de grasas. Esto incluye los perros calientes, las Ruthvencostillas, el Lampasasjamn, la salchicha, el salame y el tocino. Lcteos  Leche entera y Bigelowleche chocolatada. PPG IndustriesCrema cida. Crema. Mantequilla. Helados. Queso crema. Bebidas  Caf y t negro, con o sin cafena Bebidas gaseosas o energizantes. Condimentos  Salsa picante. Salsa barbacoa. Dulces/postres  Chocolate y cacao. Rosquillas. Menta y mentol. Grasas y Wells Fargoaceites  Alimentos con alto contenido de grasas, incluidas las papas fritas. Otros  Vinagre. Especias picantes, como la Brink's Companypimienta negra, la pimienta blanca, la pimienta roja, la pimienta de cayena, el curry en [place] Citypolvo, los clavos de Zeelandolor, el jengibre y el Arubachile en polvo. Los artculos mencionados arriba pueden no ser Raytheonuna lista completa de las bebidas y los alimentos que se Theatre stage managerdeben evitar. Comunquese con el nutricionista para recibir ms informacin.  Esta informacin no tiene Theme park managercomo fin reemplazar el consejo del mdico. Asegrese de hacerle al mdico cualquier pregunta que tenga. Document Released: 03/01/2005 Document Revised: 06/12/2014 Document Reviewed: 03/26/2013 Elsevier Interactive Patient Education  2017 Arvin[employer]vier Inc.  IF you  received an x-ray today, you will receive an invoice from [facility]. Please contact [facility] at [phone] with questions or concerns regarding your invoice.   IF you received labwork  today, you will receive an invoice from [facility]. Please contact [facility] at [phone] with questions or concerns regarding your invoice.   Our billing staff will not be able to assist you with questions regarding bills from these companies.  You will be contacted with the lab results as soon as they are available. The fastest way to get your results is to activate your My Chart account. Instructions are located on the last page of this paperwork. If you have not heard from us regarding the results in 2 weeks, please contact this office.

## 2016-11-06 ENCOUNTER — Ambulatory Visit (INDEPENDENT_AMBULATORY_CARE_PROVIDER_SITE_OTHER): Payer: BLUE CROSS/BLUE SHIELD | Admitting: Family Medicine

## 2016-11-06 ENCOUNTER — Encounter: Payer: Self-pay | Admitting: Family Medicine

## 2016-11-06 VITALS — BP 123/69 | HR 54 | Temp 98.0°F | Resp 17 | Ht 69.0 in | Wt 166.0 lb

## 2016-11-06 DIAGNOSIS — Z0000 Encounter for general adult medical examination without abnormal findings: Secondary | ICD-10-CM | POA: Diagnosis not present

## 2016-11-06 DIAGNOSIS — E781 Pure hyperglyceridemia: Secondary | ICD-10-CM | POA: Diagnosis not present

## 2016-11-06 LAB — LIPID PANEL
CHOLESTEROL TOTAL: 223 mg/dL — AB (ref 100–199)
Chol/HDL Ratio: 6 ratio — ABNORMAL HIGH (ref 0.0–5.0)
HDL: 37 mg/dL — AB (ref >39)
LDL Calculated: 147 mg/dL — ABNORMAL HIGH (ref 0–99)
Triglycerides: 194 mg/dL — ABNORMAL HIGH (ref 0–149)
VLDL Cholesterol Cal: 39 mg/dL (ref 5–40)

## 2016-11-06 NOTE — Assessment & Plan Note (Addendum)
Will check his level today. Doesn't exercise - counseled on diet and exercise today  - good add fish oil if still elevated.

## 2016-11-06 NOTE — Patient Instructions (Addendum)
Thank you for coming in,   We will call or send a letter with the results from today.     Please feel free to call with any questions or concerns at any time, at [phone]. --Dr. Jordan LikesSchmitz     IF you received an x-ray today, you will receive an invoice from [facility]. Please contact [facility] at [phone] with questions or concerns regarding your invoice.   IF you received labwork today, you will receive an invoice from [facility]. Please contact [facility] at [phone] with questions or concerns regarding your invoice.   Our billing staff will not be able to assist you with questions regarding bills from these companies.  You will be contacted with the lab results as soon as they are available. The fastest way to get your results is to activate your My Chart account. Instructions are located on the last page of this paperwork. If you have not heard from us regarding the results in 2 weeks, please contact this office.

## 2016-11-06 NOTE — Progress Notes (Signed)
  Jon Levy - [age] y.o. male MRN [id number]  Date of birth: January 01, 1971  SUBJECTIVE:  Including CC & ROS.  Chief Complaint  Patient presents with  . Annual Exam   Cardiovascular:  - Physical Activity: no   Cancer: Colorectal >> Colonoscopy: no  Lung >> Tobacco Use: no   Prostate >> Interested in DRE and/or PSA: no  Skin >> Suspicious lesions: no   ROS: No unexpected weight loss, fever, chills, swelling, instability, muscle pain, numbness/tingling, redness, otherwise see HPI   HISTORY: Past Medical, Surgical, Social, and Family History Reviewed & Updated per EMR.   Pertinent Historical Findings include: PMHx: history of gastritis  Surgical:  Cataracts   Social:  No Tobacco or alcohol use  FHx: Diabetes, HTN  PHYSICAL EXAM:  VS: BP 123/69   Pulse (!) 54   Temp 98 F (36.7 C) (Oral)   Resp 17   Ht 5\' 9"  (1.753 m)   Wt 166 lb (75.3 kg)   SpO2 98%   BMI 24.51 kg/m  PHYSICAL EXAM: Gen: NAD, alert, cooperative with exam, well-appearing HEENT: NCAT, EOMI, clear conjunctiva, oropharynx clear, supple neck CV: RRR, good S1/S2, no murmur, no edema, capillary refill brisk  Resp: CTABL, no wheezes, non-labored Abd: SNTND, BS present, no guarding or organomegaly Skin: no rashes, normal turgor  Neuro: no gross deficits.  Psych: good insight, alert and oriented   ASSESSMENT & PLAN:   Hypertriglyceridemia Will check his level today. Doesn't exercise - counseled on diet and exercise today  - good add fish oil if still elevated.

## 2017-11-16 ENCOUNTER — Ambulatory Visit (INDEPENDENT_AMBULATORY_CARE_PROVIDER_SITE_OTHER): Payer: BLUE CROSS/BLUE SHIELD | Admitting: Emergency Medicine

## 2017-11-16 ENCOUNTER — Other Ambulatory Visit: Payer: Self-pay

## 2017-11-16 ENCOUNTER — Encounter: Payer: Self-pay | Admitting: Emergency Medicine

## 2017-11-16 VITALS — BP 110/70 | HR 69 | Temp 98.6°F | Resp 16 | Ht 68.5 in | Wt 155.0 lb

## 2017-11-16 DIAGNOSIS — Z130 Encounter for screening for diseases of the blood and blood-forming organs and certain disorders involving the immune mechanism: Secondary | ICD-10-CM

## 2017-11-16 DIAGNOSIS — Z0000 Encounter for general adult medical examination without abnormal findings: Secondary | ICD-10-CM

## 2017-11-16 DIAGNOSIS — Z1329 Encounter for screening for other suspected endocrine disorder: Secondary | ICD-10-CM

## 2017-11-16 DIAGNOSIS — Z1321 Encounter for screening for nutritional disorder: Secondary | ICD-10-CM

## 2017-11-16 DIAGNOSIS — Z13228 Encounter for screening for other metabolic disorders: Secondary | ICD-10-CM

## 2017-11-16 NOTE — Patient Instructions (Addendum)
   IF you received an x-ray today, you will receive an invoice from [facility]. Please contact [facility] at [phone] with questions or concerns regarding your invoice.   IF you received labwork today, you will receive an invoice from [facility]. Please contact [facility] at [phone] with questions or concerns regarding your invoice.   Our billing staff will not be able to assist you with questions regarding bills from these companies.  You will be contacted with the lab results as soon as they are available. The fastest way to get your results is to activate your My Chart account. Instructions are located on the last page of this paperwork. If you have not heard from us regarding the results in 2 weeks, please contact this office.      Health Maintenance, Male A healthy lifestyle and preventive care is important for your health and wellness. Ask your health care provider about what schedule of regular examinations is right for you. What should I know about weight and diet? Eat a Healthy Diet  Eat plenty of vegetables, fruits, whole grains, low-fat dairy products, and lean protein.  Do not eat a lot of foods high in solid fats, added sugars, or salt.  Maintain a Healthy Weight Regular exercise can help you achieve or maintain a healthy weight. You should:  Do at least 150 minutes of exercise each week. The exercise should increase your heart rate and make you sweat (moderate-intensity exercise).  Do strength-training exercises at least twice a week.  Watch Your Levels of Cholesterol and Blood Lipids  Have your blood tested for lipids and cholesterol every 5 years starting at 49 years of age. If you are at high risk for heart disease, you should start having your blood tested when you are 49 years old. You may need to have your cholesterol levels checked more often if: ? Your lipid or cholesterol levels are high. ? You are older than 50 years of age. ? You  are at high risk for heart disease.  What should I know about cancer screening? Many types of cancers can be detected early and may often be prevented. Lung Cancer  You should be screened every year for lung cancer if: ? You are a current smoker who has smoked for at least 30 years. ? You are a former smoker who has quit within the past 15 years.  Talk to your health care provider about your screening options, when you should start screening, and how often you should be screened.  Colorectal Cancer  Routine colorectal cancer screening usually begins at 50 years of age and should be repeated every 5-10 years until you are 49 years old. You may need to be screened more often if early forms of precancerous polyps or small growths are found. Your health care provider may recommend screening at an earlier age if you have risk factors for colon cancer.  Your health care provider may recommend using home test kits to check for hidden blood in the stool.  A small camera at the end of a tube can be used to examine your colon (sigmoidoscopy or colonoscopy). This checks for the earliest forms of colorectal cancer.  Prostate and Testicular Cancer  Depending on your age and overall health, your health care provider may do certain tests to screen for prostate and testicular cancer.  Talk to your health care provider about any symptoms or concerns you have about testicular or prostate cancer.  Skin Cancer  Check your skin   from head to toe regularly.  Tell your health care provider about any new moles or changes in moles, especially if: ? There is a change in a mole's size, shape, or color. ? You have a mole that is larger than a pencil eraser.  Always use sunscreen. Apply sunscreen liberally and repeat throughout the day.  Protect yourself by wearing long sleeves, pants, a wide-brimmed hat, and sunglasses when outside.  What should I know about heart disease, diabetes, and high blood  pressure?  If you are 18-39 years of age, have your blood pressure checked every 3-5 years. If you are 40 years of age or older, have your blood pressure checked every year. You should have your blood pressure measured twice-once when you are at a hospital or clinic, and once when you are not at a hospital or clinic. Record the average of the two measurements. To check your blood pressure when you are not at a hospital or clinic, you can use: ? An automated blood pressure machine at a pharmacy. ? A home blood pressure monitor.  Talk to your health care provider about your target blood pressure.  If you are between 45-79 years old, ask your health care provider if you should take aspirin to prevent heart disease.  Have regular diabetes screenings by checking your fasting blood sugar level. ? If you are at a normal weight and have a low risk for diabetes, have this test once every three years after the age of 45. ? If you are overweight and have a high risk for diabetes, consider being tested at a younger age or more often.  A one-time screening for abdominal aortic aneurysm (AAA) by ultrasound is recommended for men aged 65-75 years who are current or former smokers. What should I know about preventing infection? Hepatitis B If you have a higher risk for hepatitis B, you should be screened for this virus. Talk with your health care provider to find out if you are at risk for hepatitis B infection. Hepatitis C Blood testing is recommended for:  Everyone born from 1945 through 1965.  Anyone with known risk factors for hepatitis C.  Sexually Transmitted Diseases (STDs)  You should be screened each year for STDs including gonorrhea and chlamydia if: ? You are sexually active and are younger than 49 years of age. ? You are older than 49 years of age and your health care provider tells you that you are at risk for this type of infection. ? Your sexual activity has changed since you were last  screened and you are at an increased risk for chlamydia or gonorrhea. Ask your health care provider if you are at risk.  Talk with your health care provider about whether you are at high risk of being infected with HIV. Your health care provider may recommend a prescription medicine to help prevent HIV infection.  What else can I do?  Schedule regular health, dental, and eye exams.  Stay current with your vaccines (immunizations).  Do not use any tobacco products, such as cigarettes, chewing tobacco, and e-cigarettes. If you need help quitting, ask your health care provider.  Limit alcohol intake to no more than 2 drinks per day. One drink equals 12 ounces of beer, 5 ounces of wine, or 1 ounces of hard liquor.  Do not use street drugs.  Do not share needles.  Ask your health care provider for help if you need support or information about quitting drugs.  Tell your health care   provider if you often feel depressed.  Tell your health care provider if you have ever been abused or do not feel safe at home. This information is not intended to replace advice given to you by your health care provider. Make sure you discuss any questions you have with your health care provider. Document Released: 11/18/2007 Document Revised: 01/19/2016 Document Reviewed: 02/23/2015 Elsevier Interactive Patient Education  2018 Elsevier Inc.  

## 2017-11-16 NOTE — Progress Notes (Addendum)
Jon Levy 47 y.o.   Chief Complaint  Patient presents with  . Annual Exam    HISTORY OF PRESENT ILLNESS: This is a 47 y.o. male Here for annual exam; no complaints and no medical concerns. In good health with no significant past medical history.  HPI   Prior to Admission medications   Not on File    Not on File  Patient Active Problem List   Diagnosis Date Noted  . Hypertriglyceridemia 11/06/2016  . History of gastritis 12/21/2013    History reviewed. No pertinent past medical history.  Past Surgical History:  Procedure Laterality Date  . VASECTOMY    . YAG LASER APPLICATION Left 04/05/2015   Procedure: YAG LASER APPLICATION;  Surgeon: Susa Simmonds, MD;  Location: [facility];  Service: Ophthalmology;  Laterality: Left;    Social History   Socioeconomic History  . Marital status: Married    Spouse name: Not on file  . Number of children: Not on file  . Years of education: Not on file  . Highest education level: Not on file  Occupational History  . Not on file  Social Needs  . Financial resource strain: Not on file  . Food insecurity:    Worry: Not on file    Inability: Not on file  . Transportation needs:    Medical: Not on file    Non-medical: Not on file  Tobacco Use  . Smoking status: Never Smoker  . Smokeless tobacco: Never Used  Substance and Sexual Activity  . Alcohol use: No  . Drug use: No  . Sexual activity: Never  Lifestyle  . Physical activity:    Days per week: Not on file    Minutes per session: Not on file  . Stress: Not on file  Relationships  . Social connections:    Talks on phone: Not on file    Gets together: Not on file    Attends religious service: Not on file    Active member of club or organization: Not on file    Attends meetings of clubs or organizations: Not on file    Relationship status: Not on file  . Intimate partner violence:    Fear of current or ex partner: Not on file    Emotionally abused: Not on file    Physically abused: Not on file    Forced sexual activity: Not on file  Other Topics Concern  . Not on file  Social History Narrative  . Not on file    Family History  Problem Relation Age of Onset  . Diabetes Mother   . Hypertension Mother   . High Cholesterol Mother   . Diabetes Father   . High Cholesterol Father      Review of Systems  Constitutional: Negative.  Negative for chills, fever, malaise/fatigue and weight loss.  HENT: Negative.  Negative for congestion, hearing loss, nosebleeds and sore throat.   Eyes: Negative.  Negative for blurred vision and double vision.  Respiratory: Negative.  Negative for cough, shortness of breath and wheezing.   Cardiovascular: Negative.  Negative for chest pain, palpitations and leg swelling.  Gastrointestinal: Negative.  Negative for abdominal pain, blood in stool, diarrhea, melena, nausea and vomiting.  Genitourinary: Negative.  Negative for dysuria and hematuria.  Musculoskeletal: Positive for back pain, joint pain and neck pain.  Skin: Negative.  Negative for rash.  Neurological: Negative.  Negative for dizziness and headaches.  Endo/Heme/Allergies: Negative.   All other systems reviewed and are  negative.  Vitals:   11/16/17 1548  BP: 110/70  Pulse: 69  Resp: 16  Temp: 98.6 F (37 C)  SpO2: 97%     Physical Exam  Constitutional: He is oriented to person, place, and time. He appears well-developed and well-nourished.  HENT:  Head: Normocephalic and atraumatic.  Right Ear: External ear normal.  Left Ear: External ear normal.  Nose: Nose normal.  Mouth/Throat: Oropharynx is clear and moist.  Eyes: Pupils are equal, round, and reactive to light. Conjunctivae are normal.  Neck: Normal range of motion. Neck supple. No JVD present. No thyromegaly present.  Cardiovascular: Normal rate, regular rhythm and normal heart sounds.  Pulmonary/Chest: Effort normal and breath sounds normal.  Abdominal: Soft. Bowel sounds are normal.  He exhibits no distension and no mass. There is no tenderness. There is no rebound.  Musculoskeletal: Normal range of motion. He exhibits no edema.  Lymphadenopathy:    He has no cervical adenopathy.  Neurological: He is alert and oriented to person, place, and time.  Skin: Skin is warm and dry. Capillary refill takes less than 2 seconds. No rash noted.  Psychiatric: He has a normal mood and affect. His behavior is normal.     ASSESSMENT & PLAN: [name] was seen today for annual exam.  Diagnoses and all orders for this visit:  Routine general medical examination at a health care facility -     CBC with Differential -     Comprehensive metabolic panel -     Hemoglobin A1c -     Lipid panel -     PSA(Must document that pt has been informed of limitations of PSA testing.)  Screening for endocrine, nutritional, metabolic and immunity disorder     Patient Instructions       IF you received an x-ray today, you will receive an invoice from [facility]. Please contact [facility] at [phone] with questions or concerns regarding your invoice.   IF you received labwork today, you will receive an invoice from [facility]. Please contact [facility] at [phone] with questions or concerns regarding your invoice.   Our billing staff will not be able to assist you with questions regarding bills from these companies.  You will be contacted with the lab results as soon as they are available. The fastest way to get your results is to activate your My Chart account. Instructions are located on the last page of this paperwork. If you have not heard from [place] regarding the results in 2 weeks, please contact this office.       Health Maintenance, Male A healthy lifestyle and preventive care is important for your health and wellness. Ask your health care provider about what schedule of regular examinations is right for you. What should I know about weight and diet? Eat a  Healthy Diet  Eat plenty of vegetables, fruits, whole grains, low-fat dairy products, and lean protein.  Do not eat a lot of foods high in solid fats, added sugars, or salt.  Maintain a Healthy Weight Regular exercise can help you achieve or maintain a healthy weight. You should:  Do at least 150 minutes of exercise each week. The exercise should increase your heart rate and make you sweat (moderate-intensity exercise).  Do strength-training exercises at least twice a week.  Watch Your Levels of Cholesterol and Blood Lipids  Have your blood tested for lipids and cholesterol every 5 years starting at 49 years of age. If you are at high risk for heart disease,  you should start having your blood tested when you are 49 years old. You may need to have your cholesterol levels checked more often if: ? Your lipid or cholesterol levels are high. ? You are older than 49 years of age. ? You are at high risk for heart disease.  What should I know about cancer screening? Many types of cancers can be detected early and may often be prevented. Lung Cancer  You should be screened every year for lung cancer if: ? You are a current smoker who has smoked for at least 30 years. ? You are a former smoker who has quit within the past 15 years.  Talk to your health care provider about your screening options, when you should start screening, and how often you should be screened.  Colorectal Cancer  Routine colorectal cancer screening usually begins at 49 years of age and should be repeated every 5-10 years until you are 49 years old. You may need to be screened more often if early forms of precancerous polyps or small growths are found. Your health care provider may recommend screening at an earlier age if you have risk factors for colon cancer.  Your health care provider may recommend using home test kits to check for hidden blood in the stool.  A small camera at the end of a tube can be used to examine  your colon (sigmoidoscopy or colonoscopy). This checks for the earliest forms of colorectal cancer.  Prostate and Testicular Cancer  Depending on your age and overall health, your health care provider may do certain tests to screen for prostate and testicular cancer.  Talk to your health care provider about any symptoms or concerns you have about testicular or prostate cancer.  Skin Cancer  Check your skin from head to toe regularly.  Tell your health care provider about any new moles or changes in moles, especially if: ? There is a change in a mole's size, shape, or color. ? You have a mole that is larger than a pencil eraser.  Always use sunscreen. Apply sunscreen liberally and repeat throughout the day.  Protect yourself by wearing long sleeves, pants, a wide-brimmed hat, and sunglasses when outside.  What should I know about heart disease, diabetes, and high blood pressure?  If you are 79-28 years of age, have your blood pressure checked every 3-5 years. If you are 29 years of age or older, have your blood pressure checked every year. You should have your blood pressure measured twice-once when you are at a hospital or clinic, and once when you are not at a hospital or clinic. Record the average of the two measurements. To check your blood pressure when you are not at a hospital or clinic, you can use: ? An automated blood pressure machine at a pharmacy. ? A home blood pressure monitor.  Talk to your health care provider about your target blood pressure.  If you are between 37-14 years old, ask your health care provider if you should take aspirin to prevent heart disease.  Have regular diabetes screenings by checking your fasting blood sugar level. ? If you are at a normal weight and have a low risk for diabetes, have this test once every three years after the age of 87. ? If you are overweight and have a high risk for diabetes, consider being tested at a younger age or more  often.  A one-time screening for abdominal aortic aneurysm (AAA) by ultrasound is recommended for men aged  65-75 years who are current or former smokers. What should I know about preventing infection? Hepatitis B If you have a higher risk for hepatitis B, you should be screened for this virus. Talk with your health care provider to find out if you are at risk for hepatitis B infection. Hepatitis C Blood testing is recommended for:  Everyone born from [id number] through 1967.  Anyone with known risk factors for hepatitis C.  Sexually Transmitted Diseases (STDs)  You should be screened each year for STDs including gonorrhea and chlamydia if: ? You are sexually active and are younger than 49 years of age. ? You are older than 49 years of age and your health care provider tells you that you are at risk for this type of infection. ? Your sexual activity has changed since you were last screened and you are at an increased risk for chlamydia or gonorrhea. Ask your health care provider if you are at risk.  Talk with your health care provider about whether you are at high risk of being infected with HIV. Your health care provider may recommend a prescription medicine to help prevent HIV infection.  What else can I do?  Schedule regular health, dental, and eye exams.  Stay current with your vaccines (immunizations).  Do not use any tobacco products, such as cigarettes, chewing tobacco, and e-cigarettes. If you need help quitting, ask your health care provider.  Limit alcohol intake to no more than 2 drinks per day. One drink equals 12 ounces of beer, 5 ounces of wine, or 1 ounces of hard liquor.  Do not use street drugs.  Do not share needles.  Ask your health care provider for help if you need support or information about quitting drugs.  Tell your health care provider if you often feel depressed.  Tell your health care provider if you have ever been abused or do not feel safe at  home. This information is not intended to replace advice given to you by your health care provider. Make sure you discuss any questions you have with your health care provider. Document Released: 11/18/2007 Document Revised: 01/19/2016 Document Reviewed: 02/23/2015 Elsevier Interactive Patient Education  2018 Elsevier Inc.      Edwina BarthMiguel Zunaira Lamy, MD [facility]

## 2017-11-17 LAB — CBC WITH DIFFERENTIAL/PLATELET
Basophils Absolute: 0 10*3/uL (ref 0.0–0.2)
Basos: 0 %
EOS (ABSOLUTE): 0 10*3/uL (ref 0.0–0.4)
EOS: 1 %
HEMATOCRIT: 46.6 % (ref 37.5–51.0)
HEMOGLOBIN: 15.9 g/dL (ref 13.0–17.7)
IMMATURE GRANS (ABS): 0 10*3/uL (ref 0.0–0.1)
Immature Granulocytes: 0 %
LYMPHS ABS: 2.1 10*3/uL (ref 0.7–3.1)
LYMPHS: 40 %
MCH: 27.3 pg (ref 26.6–33.0)
MCHC: 34.1 g/dL (ref 31.5–35.7)
MCV: 80 fL (ref 79–97)
MONOCYTES: 6 %
Monocytes Absolute: 0.3 10*3/uL (ref 0.1–0.9)
NEUTROS ABS: 2.9 10*3/uL (ref 1.4–7.0)
Neutrophils: 53 %
Platelets: 254 10*3/uL (ref 150–450)
RBC: 5.83 x10E6/uL — ABNORMAL HIGH (ref 4.14–5.80)
RDW: 14.2 % (ref 12.3–15.4)
WBC: 5.4 10*3/uL (ref 3.4–10.8)

## 2017-11-17 LAB — COMPREHENSIVE METABOLIC PANEL
ALBUMIN: 4.8 g/dL (ref 3.5–5.5)
ALK PHOS: 77 IU/L (ref 39–117)
ALT: 16 IU/L (ref 0–44)
AST: 20 IU/L (ref 0–40)
Albumin/Globulin Ratio: 2.1 (ref 1.2–2.2)
BILIRUBIN TOTAL: 0.3 mg/dL (ref 0.0–1.2)
BUN / CREAT RATIO: 13 (ref 9–20)
BUN: 15 mg/dL (ref 6–24)
CO2: 23 mmol/L (ref 20–29)
Calcium: 9.7 mg/dL (ref 8.7–10.2)
Chloride: 101 mmol/L (ref 96–106)
Creatinine, Ser: 1.14 mg/dL (ref 0.76–1.27)
GFR calc non Af Amer: 75 mL/min/{1.73_m2} (ref >59)
GFR, EST AFRICAN AMERICAN: 87 mL/min/{1.73_m2} (ref >59)
GLOBULIN, TOTAL: 2.3 g/dL (ref 1.5–4.5)
Glucose: 93 mg/dL (ref 65–99)
Potassium: 4.3 mmol/L (ref 3.5–5.2)
SODIUM: 139 mmol/L (ref 134–144)
TOTAL PROTEIN: 7.1 g/dL (ref 6.0–8.5)

## 2017-11-17 LAB — LIPID PANEL
CHOL/HDL RATIO: 4.5 ratio (ref 0.0–5.0)
Cholesterol, Total: 194 mg/dL (ref 100–199)
HDL: 43 mg/dL (ref >39)
LDL CALC: 121 mg/dL — AB (ref 0–99)
Triglycerides: 150 mg/dL — ABNORMAL HIGH (ref 0–149)
VLDL CHOLESTEROL CAL: 30 mg/dL (ref 5–40)

## 2017-11-17 LAB — HEMOGLOBIN A1C
Est. average glucose Bld gHb Est-mCnc: 114 mg/dL
HEMOGLOBIN A1C: 5.6 % (ref 4.8–5.6)

## 2017-11-17 LAB — PSA: PROSTATE SPECIFIC AG, SERUM: 0.2 ng/mL (ref 0.0–4.0)

## 2018-06-06 IMAGING — US US ABDOMEN COMPLETE
1 series · 14 of 25 positions shown · non-contrast
Comparison: None.

CLINICAL DATA: Left upper quadrant abdominal pain for 4 months.

EXAM:
ABDOMEN ULTRASOUND COMPLETE

[Series 1: us abdomen complete · 0.24mm/px · 14 of 82 slices shown]
[im 1/82]
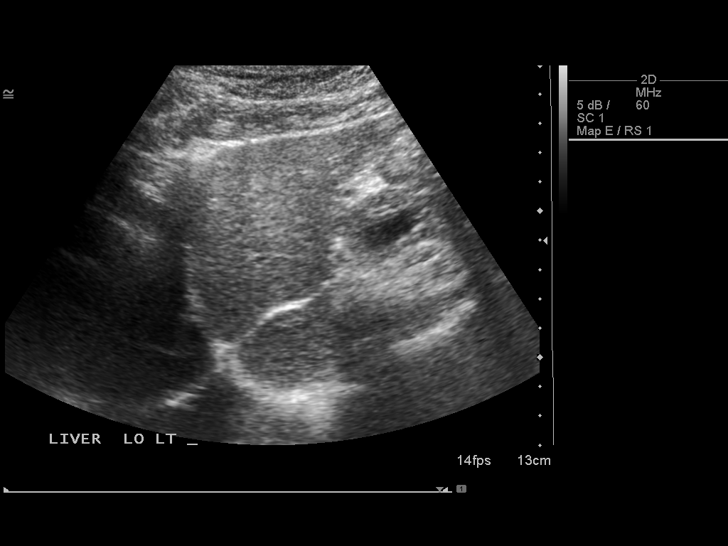
[im 7/82]
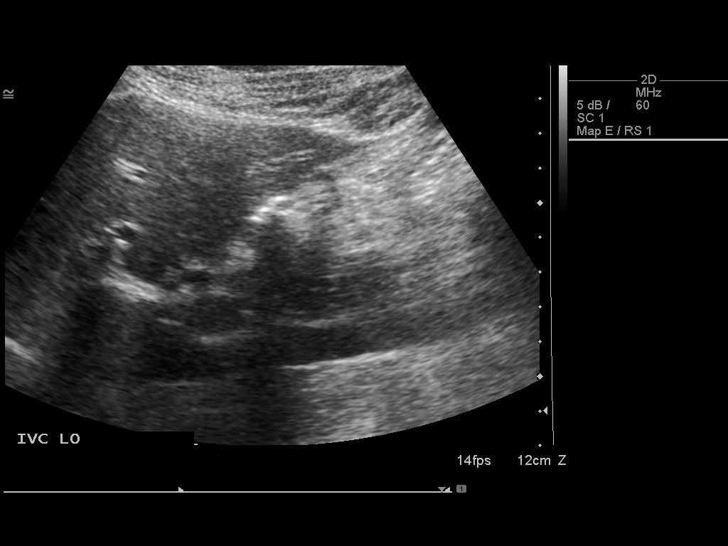
[im 14/82]
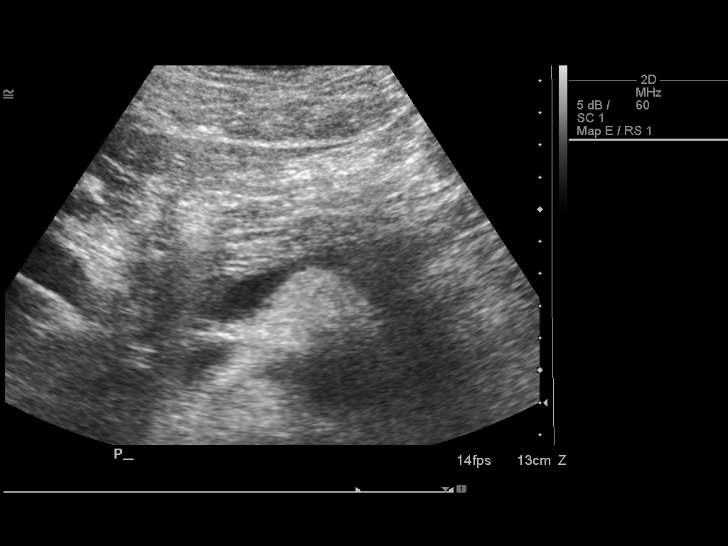
[im 21/82]
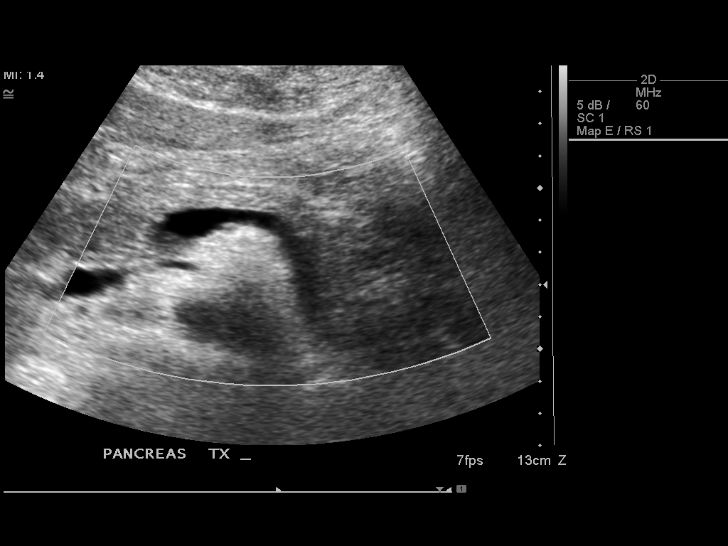
[im 28/82]
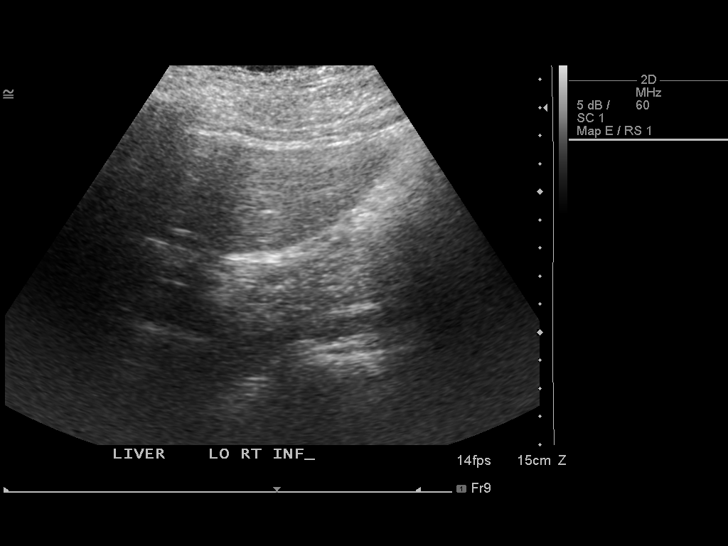
[im 31/82]
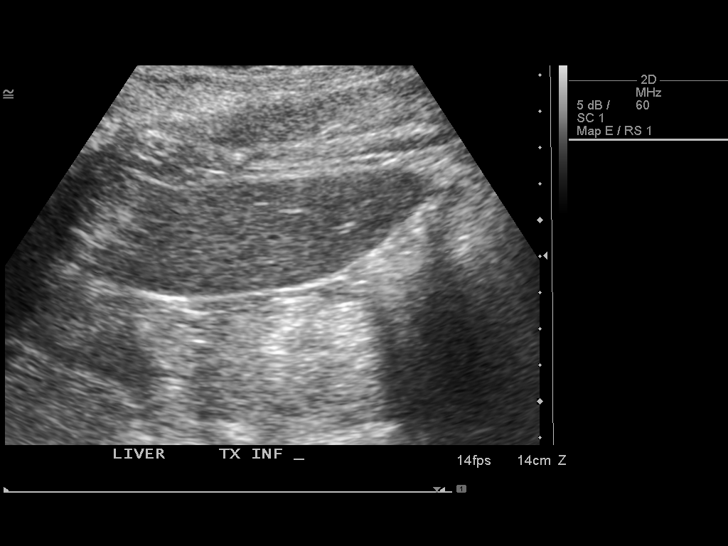
[im 38/82]
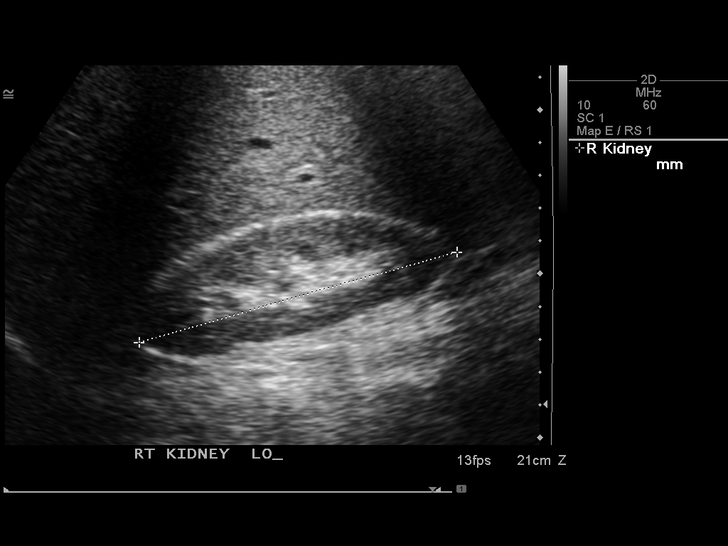
[im 44/82]
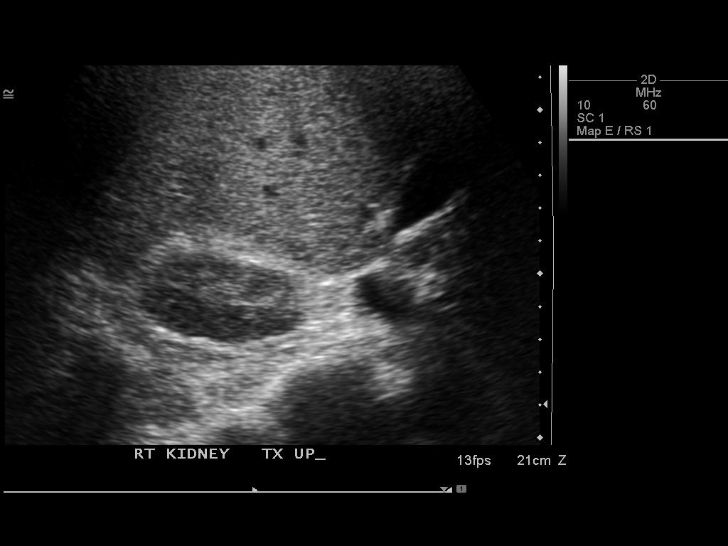
[im 51/82]
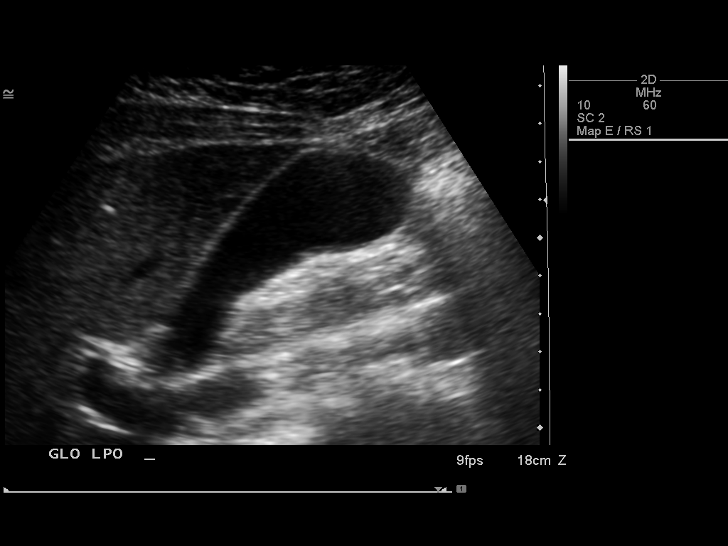
[im 55/82]
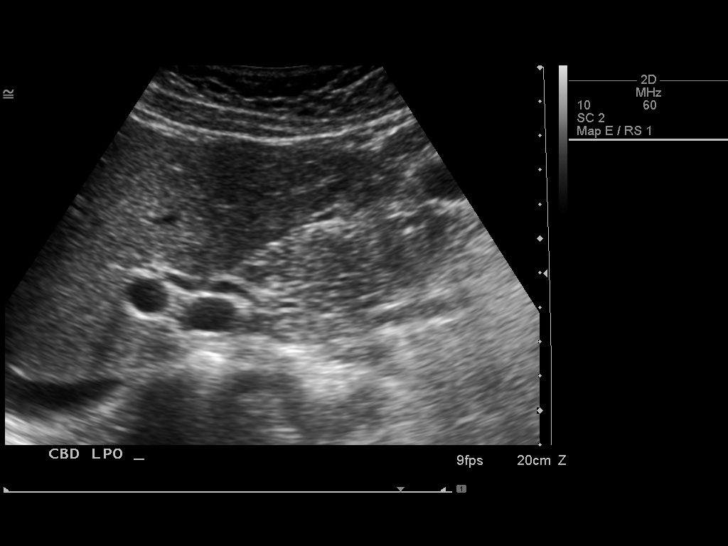
[im 61/82]
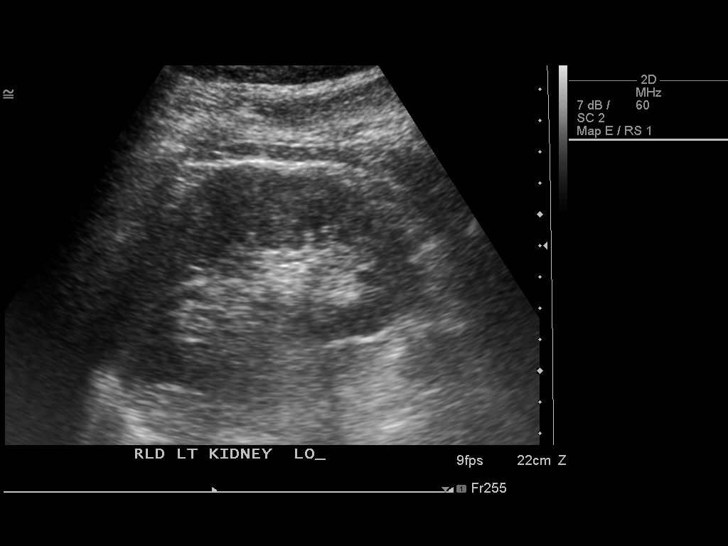
[im 68/82]
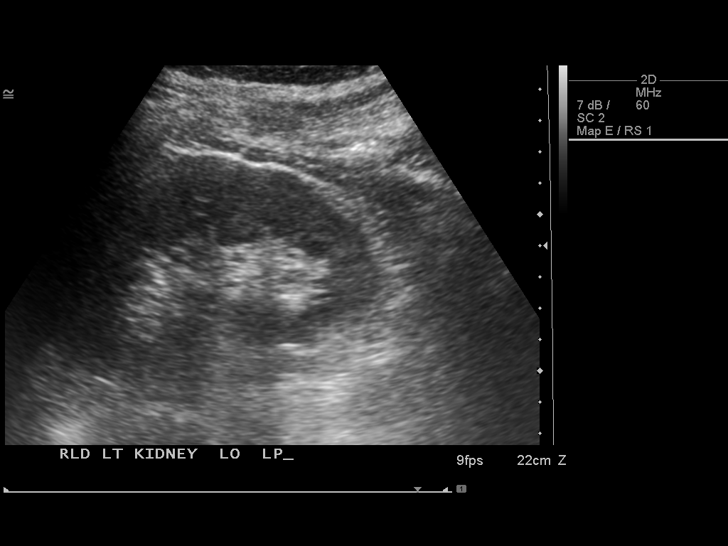
[im 75/82]
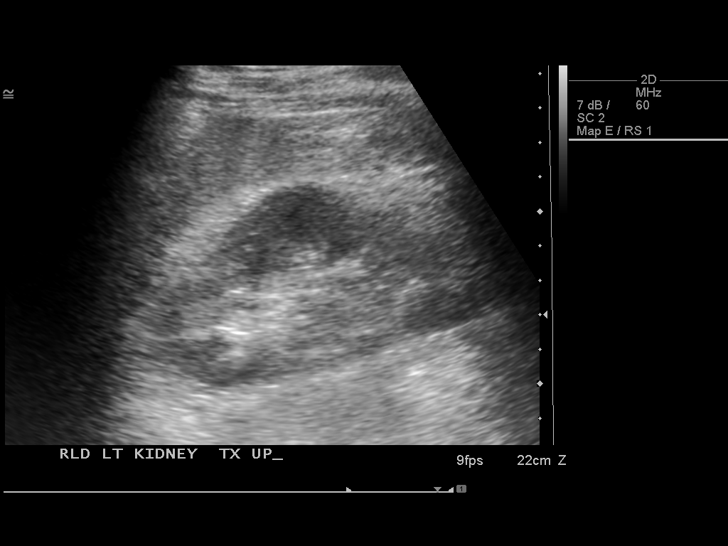
[im 82/82]
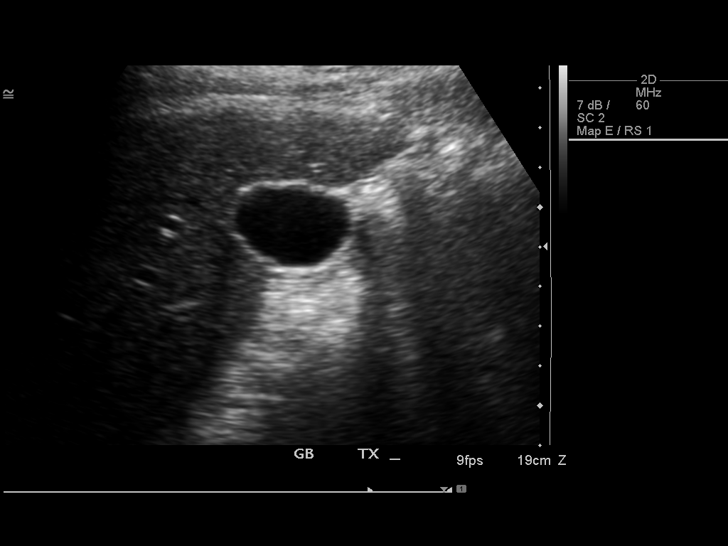

[14 of 25 positions shown; findings below may reference images not displayed]

FINDINGS: Gallbladder: No gallstones or wall thickening visualized. No
sonographic Murphy sign noted by sonographer.

Common bile duct: Diameter: 3.6 mm which is within normal limits.

Liver: No focal lesion identified. Within normal limits in
parenchymal echogenicity.

IVC: No abnormality visualized.

Pancreas: Visualized portion unremarkable.

Spleen: Size and appearance within normal limits.

Right Kidney: Length: 10 cm. Echogenicity within normal limits. No
mass or hydronephrosis visualized.

Left Kidney: Length: 11 cm. Echogenicity within normal limits. No
mass or hydronephrosis visualized.

Abdominal aorta: No aneurysm visualized.

Other findings: None.
IMPRESSION: No abnormality seen in the abdomen.

## 2020-03-15 ENCOUNTER — Other Ambulatory Visit: Payer: Self-pay

## 2020-03-15 ENCOUNTER — Encounter: Payer: Self-pay | Admitting: Emergency Medicine

## 2020-03-15 ENCOUNTER — Ambulatory Visit (INDEPENDENT_AMBULATORY_CARE_PROVIDER_SITE_OTHER): Payer: Self-pay | Admitting: Emergency Medicine

## 2020-03-15 VITALS — BP 132/74 | HR 76 | Temp 98.1°F | Resp 16 | Ht 68.5 in | Wt 149.2 lb

## 2020-03-15 DIAGNOSIS — Z13228 Encounter for screening for other metabolic disorders: Secondary | ICD-10-CM

## 2020-03-15 DIAGNOSIS — Z1211 Encounter for screening for malignant neoplasm of colon: Secondary | ICD-10-CM

## 2020-03-15 DIAGNOSIS — Z1329 Encounter for screening for other suspected endocrine disorder: Secondary | ICD-10-CM

## 2020-03-15 DIAGNOSIS — Z13220 Encounter for screening for lipoid disorders: Secondary | ICD-10-CM

## 2020-03-15 DIAGNOSIS — Z0000 Encounter for general adult medical examination without abnormal findings: Secondary | ICD-10-CM

## 2020-03-15 DIAGNOSIS — Z130 Encounter for screening for diseases of the blood and blood-forming organs and certain disorders involving the immune mechanism: Secondary | ICD-10-CM

## 2020-03-15 NOTE — Patient Instructions (Addendum)
   If you have lab work done today you will be contacted with your lab results within the next 2 weeks.  If you have not heard from us then please contact us. The fastest way to get your results is to register for My Chart.   IF you received an x-ray today, you will receive an invoice from [facility]. Please contact [facility] at [phone] with questions or concerns regarding your invoice.   IF you received labwork today, you will receive an invoice from [facility]. Please contact [facility] at [phone] with questions or concerns regarding your invoice.   Our billing staff will not be able to assist you with questions regarding bills from these companies.  You will be contacted with the lab results as soon as they are available. The fastest way to get your results is to activate your My Chart account. Instructions are located on the last page of this paperwork. If you have not heard from us regarding the results in 2 weeks, please contact this office.       Mantenimiento de la salud en los hombres Health Maintenance, Male Adoptar un estilo de vida saludable y recibir atencin preventiva son importantes para promover la salud y el bienestar. Consulte al mdico sobre:  El esquema adecuado para hacerse pruebas y exmenes peridicos.  Cosas que puede hacer por su cuenta para prevenir enfermedades y mantenerse sano. Qu debo saber sobre la dieta, el peso y el ejercicio? Consuma una dieta saludable   Consuma una dieta que incluya muchas verduras, frutas, productos lcteos con bajo contenido de grasa y protenas magras.  No consuma muchos alimentos ricos en grasas slidas, azcares agregados o sodio. Mantenga un peso saludable El ndice de masa muscular (IMC) es una medida que puede utilizarse para identificar posibles problemas de peso. Proporciona una estimacin de la grasa corporal basndose en el peso y la altura. Su mdico puede ayudarle a determinar su IMC y  a lograr o mantener un peso saludable. Haga ejercicio con regularidad Haga ejercicio con regularidad. Esta es una de las prcticas ms importantes que puede hacer por su salud. La mayora de los adultos deben seguir estas pautas:  Realizar, al menos, 150minutos de actividad fsica por semana. El ejercicio debe aumentar la frecuencia cardaca y hacerlo transpirar (ejercicio de intensidad moderada).  Hacer ejercicios de fortalecimiento por lo menos dos veces por semana. Agregue esto a su plan de ejercicio de intensidad moderada.  Pasar menos tiempo sentados. Incluso la actividad fsica ligera puede ser beneficiosa. Controle sus niveles de colesterol y lpidos en la sangre Comience a realizarse anlisis de lpidos y colesterol en la sangre a los 20aos y luego reptalos cada 5aos. Es posible que necesite controlar los niveles de colesterol con mayor frecuencia si:  Sus niveles de lpidos y colesterol son altos.  Es mayor de 40aos.  Presenta un alto riesgo de padecer enfermedades cardacas. Qu debo saber sobre las pruebas de deteccin del cncer? Muchos tipos de cncer pueden detectarse de manera temprana y, a menudo, pueden prevenirse. Segn su historia clnica y sus antecedentes familiares, es posible que deba realizarse pruebas de deteccin del cncer en diferentes edades. Esto puede incluir pruebas de deteccin de lo siguiente:  Cncer colorrectal.  Cncer de prstata.  Cncer de piel.  Cncer de pulmn. Qu debo saber sobre la enfermedad cardaca, la diabetes y la hipertensin arterial? Presin arterial y enfermedad cardaca  La hipertensin arterial causa enfermedades cardacas y aumenta el riesgo de accidente cerebrovascular. Es ms   probable que esto se manifieste en las personas que tienen lecturas de presin arterial alta, tienen ascendencia africana o tienen sobrepeso.  Hable con el mdico sobre sus valores de presin arterial deseados.  Hgase controlar la presin  arterial: ? Cada 3 a 5 aos si tiene entre 18 y 39 aos. ? Todos los aos si es mayor de 40aos.  Si tiene entre 65 y 75 aos y es fumador o sola fumar, pregntele al mdico si debe realizarse una prueba de deteccin de aneurisma artico abdominal (AAA) por nica vez. Diabetes Realcese exmenes de deteccin de la diabetes con regularidad. Este anlisis revisa el nivel de azcar en la sangre en ayunas. Hgase las pruebas de deteccin:  Cada tresaos despus de los 45aos de edad si tiene un peso normal y un bajo riesgo de padecer diabetes.  Con ms frecuencia y a partir de una edad inferior si tiene sobrepeso o un alto riesgo de padecer diabetes. Qu debo saber sobre la prevencin de infecciones? Hepatitis B Si tiene un riesgo ms alto de contraer hepatitis B, debe someterse a un examen de deteccin de este virus. Hable con el mdico para averiguar si tiene riesgo de contraer la infeccin por hepatitis B. Hepatitis C Se recomienda un anlisis de sangre para:  Todos los que nacieron entre 1945 y 1965.  Todas las personas que tengan un riesgo de haber contrado hepatitis C. Enfermedades de transmisin sexual (ETS)  Debe realizarse pruebas de deteccin de ITS todos los aos, incluidas la gonorrea y la clamidia, si: ? Es sexualmente activo y es menor de 24aos. ? Es mayor de 24aos, y el mdico le informa que corre riesgo de tener este tipo de infecciones. ? La actividad sexual ha cambiado desde que le hicieron la ltima prueba de deteccin y tiene un riesgo mayor de tener clamidia o gonorrea. Pregntele al mdico si usted tiene riesgo.  Pregntele al mdico si usted tiene un alto riesgo de contraer VIH. El mdico tambin puede recomendarle un medicamento recetado para ayudar a evitar la infeccin por el VIH. Si elige tomar medicamentos para prevenir el VIH, primero debe hacerse los anlisis de deteccin del VIH. Luego debe hacerse anlisis cada 3meses mientras est tomando los  medicamentos. Siga estas instrucciones en su casa: Estilo de vida  No consuma ningn producto que contenga nicotina o tabaco, como cigarrillos, cigarrillos electrnicos y tabaco de mascar. Si necesita ayuda para dejar de fumar, consulte al mdico.  No consuma drogas.  No comparta agujas.  Solicite ayuda a su mdico si necesita apoyo o informacin para abandonar las drogas. Consumo de alcohol  No beba alcohol si el mdico se lo prohbe.  Si bebe alcohol: ? Limite la cantidad que consume de 0 a 2 medidas por da. ? Est atento a la cantidad de alcohol que hay en las bebidas que toma. En [place], una medida equivale a una botella de cerveza de 12oz (355ml), un vaso de vino de 5oz (148ml) o un vaso de una bebida alcohlica de alta graduacin de 1oz (44ml). Instrucciones generales  Realcese los estudios de rutina de la salud, dentales y de la vista.  Mantngase al da con las vacunas.  Infrmele a su mdico si: ? Se siente deprimido con frecuencia. ? Alguna vez ha sido vctima de maltrato o no se siente seguro en su casa. Resumen  Adoptar un estilo de vida saludable y recibir atencin preventiva son importantes para promover la salud y el bienestar.  Siga las instrucciones del mdico   acerca de una dieta saludable, el ejercicio y la realizacin de pruebas o exmenes para detectar enfermedades.  Siga las instrucciones del mdico con respecto al control del colesterol y la presin arterial. Esta informacin no tiene como fin reemplazar el consejo del mdico. Asegrese de hacerle al mdico cualquier pregunta que tenga. Document Revised: 06/12/2018 Document Reviewed: 06/12/2018 Elsevier Patient Education  2020 Elsevier Inc.  

## 2020-03-15 NOTE — Progress Notes (Signed)
Jon Levy 49 y.o.   Chief Complaint  Patient presents with  . Annual Exam    feeling well no concerns     HISTORY OF PRESENT ILLNESS: This is a 50 y.o. male here for his annual exam. Healthy male with a healthy lifestyle. No chronic medical problems.  No chronic medications. Has no complaints or medical concerns today.  HPI   Prior to Admission medications   Not on File    No Known Allergies  Patient Active Problem List   Diagnosis Date Noted  . Hypertriglyceridemia 11/06/2016  . History of gastritis 12/21/2013    Past Medical History:  Diagnosis Date  . Cataract    Phreesia 03/12/2020    Past Surgical History:  Procedure Laterality Date  . EYE SURGERY N/A    Phreesia 03/12/2020  . VASECTOMY    . YAG LASER APPLICATION Left 04/05/2015   Procedure: YAG LASER APPLICATION;  Surgeon: Susa Simmonds, MD;  Location: [facility];  Service: Ophthalmology;  Laterality: Left;    Social History   Socioeconomic History  . Marital status: Married    Spouse name: Not on file  . Number of children: Not on file  . Years of education: Not on file  . Highest education level: Not on file  Occupational History  . Not on file  Tobacco Use  . Smoking status: Never Smoker  . Smokeless tobacco: Never Used  Substance and Sexual Activity  . Alcohol use: No  . Drug use: No  . Sexual activity: Never  Other Topics Concern  . Not on file  Social History Narrative  . Not on file   Social Determinants of Health   Financial Resource Strain:   . Difficulty of Paying Living Expenses: Not on file  Food Insecurity:   . Worried About Programme researcher, broadcasting/film/video in the Last Year: Not on file  . Ran Out of Food in the Last Year: Not on file  Transportation Needs:   . Lack of Transportation (Medical): Not on file  . Lack of Transportation (Non-Medical): Not on file  Physical Activity:   . Days of Exercise per Week: Not on file  . Minutes of Exercise per Session: Not on file  Stress:     . Feeling of Stress : Not on file  Social Connections:   . Frequency of Communication with Friends and Family: Not on file  . Frequency of Social Gatherings with Friends and Family: Not on file  . Attends Religious Services: Not on file  . Active Member of Clubs or Organizations: Not on file  . Attends Banker Meetings: Not on file  . Marital Status: Not on file  Intimate Partner Violence:   . Fear of Current or Ex-Partner: Not on file  . Emotionally Abused: Not on file  . Physically Abused: Not on file  . Sexually Abused: Not on file    Family History  Problem Relation Age of Onset  . Diabetes Mother   . Hypertension Mother   . High Cholesterol Mother   . Diabetes Father   . High Cholesterol Father      Review of Systems  Constitutional: Negative.  Negative for chills and fever.  HENT: Negative.  Negative for congestion and sore throat.   Respiratory: Negative.  Negative for cough and shortness of breath.   Cardiovascular: Negative.  Negative for chest pain and palpitations.  Gastrointestinal: Negative for abdominal pain, diarrhea, nausea and vomiting.  Genitourinary: Negative.  Negative for dysuria  and hematuria.  Musculoskeletal: Negative.  Negative for back pain, myalgias and neck pain.  Skin: Negative.  Negative for rash.  Neurological: Negative.  Negative for dizziness and headaches.  Endo/Heme/Allergies: Negative.   All other systems reviewed and are negative.  Today's Vitals   03/15/20 1440  BP: 132/74  Pulse: 76  Resp: 16  Temp: 98.1 F (36.7 C)  TempSrc: Temporal  SpO2: 96%  Weight: 149 lb 3.2 oz (67.7 kg)  Height: 5' 8.5" (1.74 m)   Body mass index is 22.36 kg/m.   Physical Exam Vitals reviewed.  Constitutional:      Appearance: Normal appearance.  HENT:     Head: Normocephalic.  Eyes:     Extraocular Movements: Extraocular movements intact.     Conjunctiva/sclera: Conjunctivae normal.     Pupils: Pupils are equal, round, and  reactive to light.  Neck:     Vascular: No carotid bruit.  Cardiovascular:     Rate and Rhythm: Normal rate and regular rhythm.     Pulses: Normal pulses.     Heart sounds: Normal heart sounds.  Pulmonary:     Effort: Pulmonary effort is normal.     Breath sounds: Normal breath sounds.  Abdominal:     General: There is no distension.     Palpations: Abdomen is soft.     Tenderness: There is no abdominal tenderness.  Musculoskeletal:        General: Normal range of motion.     Cervical back: Normal range of motion and neck supple. No tenderness.     Right lower leg: No edema.     Left lower leg: No edema.  Lymphadenopathy:     Cervical: No cervical adenopathy.  Skin:    General: Skin is warm and dry.     Capillary Refill: Capillary refill takes less than 2 seconds.  Neurological:     General: No focal deficit present.     Mental Status: He is alert and oriented to person, place, and time.  Psychiatric:        Mood and Affect: Mood normal.        Behavior: Behavior normal.      ASSESSMENT & PLAN: Dionte was seen today for annual exam.  Diagnoses and all orders for this visit:  Routine general medical examination at a health care facility  Colon cancer screening -     Ambulatory referral to Gastroenterology  Screening for deficiency anemia -     CBC with Differential/Platelet  Screening for lipoid disorders -     Lipid panel  Screening for endocrine, metabolic and immunity disorder -     Comprehensive metabolic panel    Patient Instructions       If you have lab work done today you will be contacted with your lab results within the next 2 weeks.  If you have not heard from [place] then please contact us. The fastest way to get your results is to register for My Chart.   IF you received an x-ray today, you will receive an invoice from [facility]. Please contact [facility] at [phone] with questions or concerns regarding your invoice.    IF you received labwork today, you will receive an invoice from [facility]. Please contact [facility] at [phone] with questions or concerns regarding your invoice.   Our billing staff will not be able to assist you with questions regarding bills from these companies.  You will be contacted with the lab results as soon as they  are available. The fastest way to get your results is to activate your My Chart account. Instructions are located on the last page of this paperwork. If you have not heard from [place] regarding the results in 2 weeks, please contact this office.      Mantenimiento de Research officer, political party, Male Adoptar un estilo de vida saludable y recibir atencin preventiva son importantes para promover la salud y Counsellor. Consulte al mdico sobre:  El esquema adecuado para hacerse pruebas y exmenes peridicos.  Cosas que puede hacer por su cuenta para prevenir enfermedades y [place] sano. Qu debo saber sobre la dieta, el peso y el ejercicio? Consuma una dieta saludable   Consuma una dieta que incluya muchas verduras, frutas, productos lcteos con bajo contenido de [place] y Associate Professor.  No consuma muchos alimentos ricos en grasas slidas, azcares agregados o sodio. Mantenga un peso saludable El ndice de masa muscular [facility]) es una medida que puede utilizarse para identificar posibles problemas de [place]. Proporciona una estimacin de la grasa corporal basndose en el peso y la altura. Su mdico puede ayudarle a Engineer, site IMC y a Personnel officer o Pharmacologist un peso saludable. Haga ejercicio con regularidad Haga ejercicio con regularidad. Esta es una de las prcticas ms importantes que puede hacer por su salud. La mayora de los adultos deben seguir estas pautas:  Education officer, environmental, al menos, de actividad fsica por semana. El ejercicio debe aumentar la frecuencia cardaca y Media planner transpirar (ejercicio de intensidad moderada).  Hacer ejercicios  de fortalecimiento por lo [employer] por semana. Agregue esto a su plan de ejercicio de intensidad moderada.  Pasar menos tiempo sentados. Incluso la actividad fsica ligera puede ser beneficiosa. Controle sus niveles de colesterol y lpidos en la sangre Comience a realizarse anlisis de lpidos y Oncologist en la sangre a los 20aos y luego reptalos cada 5aos. Es posible que Insurance underwriter los niveles de colesterol con mayor frecuencia si:  Sus niveles de lpidos y colesterol son altos.  Es mayor de 40aos.  Presenta un alto riesgo de padecer enfermedades cardacas. Qu debo saber sobre las pruebas de deteccin del cncer? Muchos tipos de cncer pueden detectarse de manera temprana y, a menudo, pueden prevenirse. Segn su historia clnica y sus antecedentes familiares, es posible que deba realizarse pruebas de deteccin del cncer en diferentes edades. Esto puede incluir pruebas de deteccin de lo siguiente:  Building services engineer.  Cncer de prstata.  Cncer de piel.  Cncer de pulmn. Qu debo saber sobre la enfermedad cardaca, la diabetes y la hipertensin arterial? Presin arterial y enfermedad cardaca  La hipertensin arterial causa enfermedades cardacas y [place] el riesgo de accidente cerebrovascular. Es ms probable que esto se manifieste en las personas que tienen lecturas de presin arterial alta, tienen ascendencia africana o tienen sobrepeso.  Hable con el mdico sobre sus valores de presin arterial deseados.  Hgase controlar la presin arterial: ? Cada 3 a 5 aos si tiene entre 18 y 25 aos. ? Todos los aos si es mayor de [place].  Si tiene entre 65 y 13 aos y es fumador o Insurance underwriter, pregntele al mdico si debe realizarse una prueba de deteccin de aneurisma artico abdominal (AAA) por nica vez. Diabetes Realcese exmenes de deteccin de la diabetes con regularidad. Este anlisis revisa el nivel de azcar en la sangre en [place]. Hgase las  pruebas de deteccin:  Cada tresaos despus de los 45aos de edad si tiene un peso normal y  un bajo riesgo de padecer diabetes.  Con ms frecuencia y a partir de [place] edad inferior si tiene sobrepeso o un alto riesgo de padecer diabetes. Qu debo saber sobre la prevencin de infecciones? Hepatitis B Si tiene un riesgo ms alto de contraer hepatitis B, debe someterse a un examen de deteccin de este virus. Hable con el mdico para averiguar si tiene riesgo de contraer la infeccin por hepatitis B. Hepatitis C Se recomienda un anlisis de [place] para:  Todos los que nacieron entre 1947 y [phone].  Todas las personas que tengan un riesgo de haber contrado hepatitis C. Enfermedades de transmisin sexual (ETS)  Debe realizarse pruebas de deteccin de ITS todos los aos, incluidas la gonorrea y la clamidia, si: ? Es sexualmente activo y es menor de 24aos. ? Es mayor de 24aos, y Public affairs consultantel mdico le informa que corre riesgo de tener este tipo de infecciones. ? La actividad sexual ha cambiado desde que le hicieron la ltima prueba de deteccin y tiene un riesgo mayor de Warehouse managertener clamidia o Copygonorrea. Pregntele al mdico si usted tiene riesgo.  Pregntele al mdico si usted tiene un alto riesgo de Primary school teachercontraer VIH. El mdico tambin puede recomendarle un medicamento recetado para ayudar a evitar la infeccin por el VIH. Si elige tomar medicamentos para prevenir el VIH, primero debe ONEOKhacerse los anlisis de deteccin del VIH. Luego debe hacerse anlisis cada 3meses mientras est tomando los medicamentos. Siga estas instrucciones en su casa: Estilo de vida  No consuma ningn producto que contenga nicotina o tabaco, como cigarrillos, cigarrillos electrnicos y tabaco de Theatre managermascar. Si necesita ayuda para dejar de fumar, consulte al mdico.  No consuma drogas.  No comparta agujas.  Solicite ayuda a su mdico si necesita apoyo o informacin para abandonar las drogas. Consumo de alcohol  No beba alcohol si el  mdico se lo prohbe.  Si bebe alcohol: ? Limite la cantidad que consume de 0 a 2 medidas por da. ? Est atento a la cantidad de alcohol que hay en las bebidas que toma. En los [place], una medida equivale a una botella de cerveza de 12oz (355ml), un vaso de vino de 5oz (148ml) o un vaso de una bebida alcohlica de alta graduacin de 1oz (44ml). Instrucciones generales  Realcese los estudios de rutina de la salud, dentales y de Wellsite geologistla vista.  Mantngase al da con las vacunas.  Infrmele a su mdico si: ? Se siente deprimido con frecuencia. ? Alguna vez ha sido vctima de [place] o no se siente seguro en su casa. Resumen  Adoptar un estilo de vida saludable y recibir atencin preventiva son importantes para promover la salud y Counsellorel bienestar.  Siga las instrucciones del mdico acerca de una dieta saludable, el ejercicio y la realizacin de pruebas o exmenes para Hotel managerdetectar enfermedades.  Siga las instrucciones del mdico con respecto al control del colesterol y la presin arterial. Esta informacin no tiene Theme park managercomo fin reemplazar el consejo del mdico. Asegrese de hacerle al mdico cualquier pregunta que tenga. Document Revised: 06/12/2018 Document Reviewed: 06/12/2018 Elsevier Patient Education  2020 Elsevier Inc.      Edwina BarthMiguel Meagan Ancona, MD [facility]

## 2020-03-16 LAB — CBC WITH DIFFERENTIAL/PLATELET
Basophils Absolute: 0 10*3/uL (ref 0.0–0.2)
Basos: 0 %
EOS (ABSOLUTE): 0 10*3/uL (ref 0.0–0.4)
Eos: 0 %
Hematocrit: 45.3 % (ref 37.5–51.0)
Hemoglobin: 15.2 g/dL (ref 13.0–17.7)
Immature Grans (Abs): 0 10*3/uL (ref 0.0–0.1)
Immature Granulocytes: 0 %
Lymphocytes Absolute: 1.9 10*3/uL (ref 0.7–3.1)
Lymphs: 29 %
MCH: 27.4 pg (ref 26.6–33.0)
MCHC: 33.6 g/dL (ref 31.5–35.7)
MCV: 82 fL (ref 79–97)
Monocytes Absolute: 0.4 10*3/uL (ref 0.1–0.9)
Monocytes: 6 %
Neutrophils Absolute: 4.1 10*3/uL (ref 1.4–7.0)
Neutrophils: 65 %
Platelets: 276 10*3/uL (ref 150–450)
RBC: 5.54 x10E6/uL (ref 4.14–5.80)
RDW: 13.1 % (ref 11.6–15.4)
WBC: 6.5 10*3/uL (ref 3.4–10.8)

## 2020-03-16 LAB — COMPREHENSIVE METABOLIC PANEL
ALT: 17 IU/L (ref 0–44)
AST: 17 IU/L (ref 0–40)
Albumin/Globulin Ratio: 2 (ref 1.2–2.2)
Albumin: 4.6 g/dL (ref 3.8–4.9)
Alkaline Phosphatase: 71 IU/L (ref 44–121)
BUN/Creatinine Ratio: 15 (ref 9–20)
BUN: 15 mg/dL (ref 6–24)
Bilirubin Total: 0.5 mg/dL (ref 0.0–1.2)
CO2: 25 mmol/L (ref 20–29)
Calcium: 9.3 mg/dL (ref 8.7–10.2)
Chloride: 101 mmol/L (ref 96–106)
Creatinine, Ser: 1.01 mg/dL (ref 0.76–1.27)
GFR calc Af Amer: 99 mL/min/{1.73_m2} (ref >59)
GFR calc non Af Amer: 86 mL/min/{1.73_m2} (ref >59)
Globulin, Total: 2.3 g/dL (ref 1.5–4.5)
Glucose: 82 mg/dL (ref 65–99)
Potassium: 4.2 mmol/L (ref 3.5–5.2)
Sodium: 139 mmol/L (ref 134–144)
Total Protein: 6.9 g/dL (ref 6.0–8.5)

## 2020-03-16 LAB — LIPID PANEL
Chol/HDL Ratio: 4 ratio (ref 0.0–5.0)
Cholesterol, Total: 185 mg/dL (ref 100–199)
HDL: 46 mg/dL (ref >39)
LDL Chol Calc (NIH): 123 mg/dL — ABNORMAL HIGH (ref 0–99)
Triglycerides: 89 mg/dL (ref 0–149)
VLDL Cholesterol Cal: 16 mg/dL (ref 5–40)

## 2021-03-16 ENCOUNTER — Encounter: Payer: Self-pay | Admitting: Emergency Medicine

## 2023-02-24 LAB — AMB RESULTS CONSOLE CBG: Glucose: 134

## 2023-05-14 ENCOUNTER — Ambulatory Visit
Admission: RE | Admit: 2023-05-14 | Discharge: 2023-05-14 | Disposition: A | Discharge status: Home / Self Care | Payer: BLUE CROSS/BLUE SHIELD | Source: Ambulatory Visit | Attending: Family Medicine | Admitting: Family Medicine

## 2023-05-14 VITALS — BP 127/85 | HR 61 | Temp 97.8°F | Resp 16

## 2023-05-14 DIAGNOSIS — H6691 Otitis media, unspecified, right ear: Secondary | ICD-10-CM

## 2023-05-14 DIAGNOSIS — H6012 Cellulitis of left external ear: Secondary | ICD-10-CM

## 2023-05-14 DIAGNOSIS — H6123 Impacted cerumen, bilateral: Secondary | ICD-10-CM

## 2023-05-14 MED ORDER — AMOXICILLIN-POT CLAVULANATE 875-125 MG PO TABS: 1.0000 | ORAL_TABLET | Freq: Two times a day (BID) | ORAL | 0 refills | Status: AC

## 2023-05-14 NOTE — Discharge Instructions (Addendum)
 Advised patient to take medication as directed with food to completion.  Encouraged to increase daily water intake to 64 ounces per day while taking this medication.  Advised if symptoms worsen and/or unresolved please follow-up with PCP or here for further evaluation.

## 2023-05-14 NOTE — ED Provider Notes (Signed)
[facility]    CSN: [id number] Arrival date & time: 05/14/23  0932      History   Chief Complaint Chief Complaint  Patient presents with   Otalgia    Appt 10am    HPI Jon Levy is a 52 y.o. male.   HPI pleasant 52 year old male presents with bilateral ear fullness.  PMH significant for cataract, history of gastritis, and hypertriglyceridemia.  Past Medical History:  Diagnosis Date   Cataract    Phreesia 03/12/2020    Patient Active Problem List   Diagnosis Date Noted   Hypertriglyceridemia 11/06/2016   History of gastritis 12/21/2013    Past Surgical History:  Procedure Laterality Date   EYE SURGERY N/A    Phreesia 03/12/2020   VASECTOMY     YAG LASER APPLICATION Left 04/05/2015   Procedure: YAG LASER APPLICATION;  Surgeon: Susa Simmonds, MD;  Location: [facility];  Service: Ophthalmology;  Laterality: Left;       Home Medications    Prior to Admission medications   Medication Sig Start Date End Date Taking? Authorizing Provider  amoxicillin-clavulanate (AUGMENTIN) 875-125 MG tablet Take 1 tablet by mouth 2 (two) times daily for 10 days. 05/14/23 05/24/23 Yes Trevor Iha, FNP    Family History Family History  Problem Relation Age of Onset   Diabetes Mother    Hypertension Mother    High Cholesterol Mother    Diabetes Father    High Cholesterol Father     Social History Social History   Tobacco Use   Smoking status: Never   Smokeless tobacco: Never  Substance Use Topics   Alcohol use: No   Drug use: No     Allergies   Patient has no known allergies.   Review of Systems Review of Systems  HENT:  Positive for ear pain.      Physical Exam Triage Vital Signs ED Triage Vitals  Encounter Vitals Group     BP      Systolic BP Percentile      Diastolic BP Percentile      Pulse      Resp      Temp      Temp src      SpO2      Weight      Height      Head Circumference      Peak Flow      Pain Score      Pain  Loc      Pain Education      Exclude from Growth Chart    No data found.  Updated Vital Signs BP 127/85   Pulse 61   Temp 97.8 F (36.6 C)   Resp 16   SpO2 98%    Physical Exam Vitals and nursing note reviewed.  Constitutional:      Appearance: Normal appearance. He is normal weight.  HENT:     Head: Normocephalic and atraumatic.     Right Ear: External ear normal.     Left Ear: External ear normal.     Ears:     Comments: Tragus: Erythematous/indurated.  Bilateral EACs occluded with excessive cerumen unable to visualize either TM.  Post bilateral ear lavage: Bilateral EACs clear, mildly erythematous from previous cerumen accumulation; \Right TM: Erythematous, bulging; Left TM: Clear, retracted with good light reflex and mobility    Mouth/Throat:     Mouth: Mucous membranes are moist.     Pharynx: Oropharynx is clear.  Eyes:  Extraocular Movements: Extraocular movements intact.     Conjunctiva/sclera: Conjunctivae normal.     Pupils: Pupils are equal, round, and reactive to light.  Cardiovascular:     Rate and Rhythm: Normal rate and regular rhythm.     Pulses: Normal pulses.     Heart sounds: Normal heart sounds. No murmur heard. Pulmonary:     Effort: Pulmonary effort is normal.     Breath sounds: Normal breath sounds. No wheezing, rhonchi or rales.  Musculoskeletal:        General: Normal range of motion.     Cervical back: Normal range of motion and neck supple.  Skin:    General: Skin is warm and dry.  Neurological:     General: No focal deficit present.     Mental Status: He is alert and oriented to person, place, and time. Mental status is at baseline.  Psychiatric:        Mood and Affect: Mood normal.        Behavior: Behavior normal.      UC Treatments / Results  Labs (all labs ordered are listed, but only abnormal results are displayed) Labs Reviewed - No data to display  EKG   Radiology No results found.  Procedures Procedures (including  critical care time)  Medications Ordered in UC Medications - No data to display  Initial Impression / Assessment and Plan / UC Course  I have reviewed the triage vital signs and the nursing notes.  Pertinent labs & imaging results that were available during my care of the patient were reviewed by me and considered in my medical decision making (see chart for details).     MDM: 1.  Cellulitis of tragus of left ear-Rx'd Augmentin 875 mg tablet: Take 1 tablet twice daily x 10 days; 2.  Acute right otitis media-Rx'd Augmentin 875 mg tablet: Take 1 tablet twice daily x 10 days; 3.  Bilateral impacted cerumen-resolved with bilateral ear lavage. Advised patient to take medication as directed with food to completion.  Encouraged to increase daily water intake to 64 ounces per day while taking this medication.  Advised if symptoms worsen and/or unresolved please follow-up with PCP or here for further evaluation.  Patient discharged home, hemodynamically stable. Final Clinical Impressions(s) / UC Diagnoses   Final diagnoses:  Bilateral impacted cerumen  Cellulitis of tragus of left ear  Acute right otitis media     Discharge Instructions      Advised patient to take medication as directed with food to completion.  Encouraged to increase daily water intake to 64 ounces per day while taking this medication.  Advised if symptoms worsen and/or unresolved please follow-up with PCP or here for further evaluation.     ED Prescriptions     Medication Sig Dispense Auth. Provider   amoxicillin-clavulanate (AUGMENTIN) 875-125 MG tablet Take 1 tablet by mouth 2 (two) times daily for 10 days. 20 tablet Trevor Iha, FNP      PDMP not reviewed this encounter.   Trevor Iha, FNP 05/14/23 1141

## 2023-05-14 NOTE — ED Triage Notes (Signed)
Pt presents to [facility] with co of otalgia and ear fullness in left ear 3 days ago. No otc medications.
